# Patient Record
Sex: Female | Born: 1970
Health system: Southern US, Community
[De-identification: ages and names within clinical notes are randomized; demographics above are authoritative.]

## PROBLEM LIST (undated history)

## (undated) DIAGNOSIS — R569 Unspecified convulsions: Secondary | ICD-10-CM

## (undated) DIAGNOSIS — E039 Hypothyroidism, unspecified: Secondary | ICD-10-CM

## (undated) DIAGNOSIS — F32A Depression, unspecified: Secondary | ICD-10-CM

## (undated) DIAGNOSIS — D649 Anemia, unspecified: Secondary | ICD-10-CM

## (undated) DIAGNOSIS — E785 Hyperlipidemia, unspecified: Secondary | ICD-10-CM

## (undated) DIAGNOSIS — G919 Hydrocephalus, unspecified: Secondary | ICD-10-CM

## (undated) DIAGNOSIS — E669 Obesity, unspecified: Secondary | ICD-10-CM

## (undated) DIAGNOSIS — K219 Gastro-esophageal reflux disease without esophagitis: Secondary | ICD-10-CM

## (undated) DIAGNOSIS — F329 Major depressive disorder, single episode, unspecified: Secondary | ICD-10-CM

## (undated) HISTORY — DX: Hydrocephalus, unspecified: G91.9

## (undated) HISTORY — DX: Gastro-esophageal reflux disease without esophagitis: K21.9

## (undated) HISTORY — DX: Hyperlipidemia, unspecified: E78.5

## (undated) HISTORY — PX: VENTRICULOPERITONEAL SHUNT: SHX204

## (undated) HISTORY — DX: Unspecified convulsions: R56.9

## (undated) HISTORY — DX: Hypothyroidism, unspecified: E03.9

## (undated) HISTORY — DX: Obesity, unspecified: E66.9

## (undated) HISTORY — DX: Anemia, unspecified: D64.9

## (undated) HISTORY — DX: Major depressive disorder, single episode, unspecified: F32.9

## (undated) HISTORY — DX: Depression, unspecified: F32.A

---

## 1994-06-18 HISTORY — PX: ANKLE SURGERY: SHX546

## 1997-12-23 ENCOUNTER — Other Ambulatory Visit: Admission: RE | Admit: 1997-12-23 | Discharge: 1997-12-23 | Payer: Self-pay | Admitting: Obstetrics and Gynecology

## 1998-12-05 ENCOUNTER — Other Ambulatory Visit: Admission: RE | Admit: 1998-12-05 | Discharge: 1998-12-05 | Payer: Self-pay | Admitting: Obstetrics and Gynecology

## 1999-12-28 ENCOUNTER — Other Ambulatory Visit: Admission: RE | Admit: 1999-12-28 | Discharge: 1999-12-28 | Payer: Self-pay | Admitting: Obstetrics and Gynecology

## 2001-03-10 ENCOUNTER — Other Ambulatory Visit: Admission: RE | Admit: 2001-03-10 | Discharge: 2001-03-10 | Payer: Self-pay | Admitting: Gynecology

## 2002-04-01 ENCOUNTER — Other Ambulatory Visit: Admission: RE | Admit: 2002-04-01 | Discharge: 2002-04-01 | Payer: Self-pay | Admitting: Gynecology

## 2002-11-20 ENCOUNTER — Encounter: Payer: Self-pay | Admitting: Neurosurgery

## 2002-11-20 ENCOUNTER — Ambulatory Visit (HOSPITAL_COMMUNITY): Admission: RE | Admit: 2002-11-20 | Discharge: 2002-11-20 | Payer: Self-pay | Admitting: Neurosurgery

## 2003-03-29 ENCOUNTER — Emergency Department (HOSPITAL_COMMUNITY): Admission: EM | Admit: 2003-03-29 | Discharge: 2003-03-29 | Payer: Self-pay | Admitting: Emergency Medicine

## 2003-05-11 ENCOUNTER — Other Ambulatory Visit: Admission: RE | Admit: 2003-05-11 | Discharge: 2003-05-11 | Payer: Self-pay | Admitting: Gynecology

## 2003-11-15 ENCOUNTER — Ambulatory Visit (HOSPITAL_COMMUNITY): Admission: RE | Admit: 2003-11-15 | Discharge: 2003-11-15 | Payer: Self-pay | Admitting: Neurosurgery

## 2004-11-14 ENCOUNTER — Other Ambulatory Visit: Admission: RE | Admit: 2004-11-14 | Discharge: 2004-11-14 | Payer: Self-pay | Admitting: Internal Medicine

## 2004-11-14 ENCOUNTER — Other Ambulatory Visit: Admission: RE | Admit: 2004-11-14 | Discharge: 2004-11-14 | Payer: Self-pay | Admitting: Obstetrics and Gynecology

## 2005-05-27 ENCOUNTER — Inpatient Hospital Stay (HOSPITAL_COMMUNITY): Admission: AD | Admit: 2005-05-27 | Discharge: 2005-05-29 | Payer: Self-pay | Admitting: Obstetrics and Gynecology

## 2006-05-11 ENCOUNTER — Emergency Department (HOSPITAL_COMMUNITY): Admission: EM | Admit: 2006-05-11 | Discharge: 2006-05-11 | Payer: Self-pay | Admitting: Emergency Medicine

## 2006-05-12 ENCOUNTER — Emergency Department (HOSPITAL_COMMUNITY): Admission: EM | Admit: 2006-05-12 | Discharge: 2006-05-12 | Payer: Self-pay | Admitting: Emergency Medicine

## 2007-12-12 ENCOUNTER — Encounter: Admission: RE | Admit: 2007-12-12 | Discharge: 2007-12-12 | Payer: Self-pay | Admitting: Neurosurgery

## 2008-01-31 ENCOUNTER — Emergency Department (HOSPITAL_COMMUNITY): Admission: EM | Admit: 2008-01-31 | Discharge: 2008-01-31 | Payer: Self-pay | Admitting: Emergency Medicine

## 2010-05-15 ENCOUNTER — Encounter: Admission: RE | Admit: 2010-05-15 | Discharge: 2010-05-15 | Payer: Self-pay | Admitting: Family Medicine

## 2010-07-08 ENCOUNTER — Encounter: Payer: Self-pay | Admitting: Family Medicine

## 2010-11-03 NOTE — Discharge Summary (Signed)
Ashley Boone, Ashley Boone             ACCOUNT NO.:  [id number]   MEDICAL RECORD NO.:  [id number]          PATIENT TYPE:  INP   LOCATION:  9110                          FACILITY:  [facility]   PHYSICIAN:  Zenaida Niece, M.D.DATE OF BIRTH:  1970/10/18   DATE OF ADMISSION:  05/27/2005  DATE OF DISCHARGE:  05/29/2005                                 DISCHARGE SUMMARY   ADMISSION DIAGNOSES:  1.  Intrauterine pregnancy at 39 weeks.  2.  Previous cesarean section and vaginal birth after cesarean.  3.  Hypothyroidism.  4.  Advanced maternal age.  5.  Group B strep carrier.   DISCHARGE DIAGNOSES:  1.  Intrauterine pregnancy at 39 weeks.  2.  Previous cesarean section and vaginal birth after cesarean.  3.  Hypothyroidism.  4.  Advanced maternal age.  5.  Mild shoulder dystocia.  6.  Group B strep carrier.   PROCEDURES:  On May 28, 2005, she had a VBAC.   HISTORY AND PHYSICAL:  This is a 40 year old white female gravida 3, para 2-  0-0-2 with an EGA of [redacted] weeks by in vitro fertilization, who presents with a  complaint of regular contractions.  Evaluation on admission revealed her to  be 5 cm dilated with regular contractions.  Prenatal care complicated by  advanced maternal age with normal triple screening, normal ultrasound,  previous low-transverse cesarean section and VBAC, and she desires another  VBAC.  She also conceived by in vitro fertilization.   PRENATAL LABORATORY:  Blood type is O positive with negative antibody  screen.  RPR non-reactive, rubella immune, hepatitis B surface antigen  negative, HIV negative, gonorrhea and chlamydia negative, group B strep is  positive.   PAST OBSTETRIC HISTORY:  1995 low transverse cesarean section at 40 weeks  for breech presentation, 8 pounds 12 ounces.  In 1998, VBAC at 39 weeks, 8  pounds 8 ounces, no complications.   PAST MEDICAL HISTORY:  Hypothyroidism and hydrocephalous with a VP shunt.   PAST SURGICAL HISTORY:  A fractured ankle,  laparoscopy, VP shunt, and  cesarean section.   MEDICATIONS:  Synthroid.   PHYSICAL EXAMINATION:  VITAL SIGNS:  She is afebrile with stable vital  signs.  Fetal heart tracing is reassuring.  ABDOMEN:  Gravid, non-tender with an estimated fetal weight of 8 pounds.  CERVIX:  On my first exam, she is 8, 80, and -1 with a vertex presentation  and amniotomy reveals clear fluid.   HOSPITAL COURSE:  The patient was admitted and continued to labor on her  own.  She was put on penicillin for group B strep prophylaxis.  She then had  a slightly protracted course and was put on low-dose Pitocin.  She then  progressed to complete, pushed well, and on the morning of May 27, 2005  had a vaginal delivery of a viable female infant with Apgars of 8 and 9 that  weighed 8 pounds 13 ounces.  A loose nuchal cord x1 was reduced.  She had a  mild shoulder dystocia, which resolved just with McRoberts' maneuver.  Placenta delivered spontaneous, was intact.  She had a  second degree  laceration repaired with 3-0 Vicryl with local block and estimated blood  loss was less than 500 mL.  Uterine scar palpated intact.  Postpartum, she  had no significant complications and on postpartum day #2 was felt to be  stable enough for discharge home.   DISCHARGE INSTRUCTIONS:  Regular diet, pelvic rest, follow-up in six weeks.   MEDICATIONS:  1.  Percocet, #20, p.r.n.  2.  Over-the-counter Ibuprofen as needed p.r.n.   She is also given our discharge pamphlet.      Zenaida Niece, M.D.  Electronically Signed     TDM/MEDQ  D:  05/29/2005  T:  05/29/2005  Job:  [id number]

## 2012-11-13 IMAGING — RF DG UGI W/ HIGH DENSITY W/KUB
19 of 24 series · 19 of 24 positions shown · non-contrast
Comparison: None.

CLINICAL DATA: Heartburn

UPPER GI SERIES WITH KUB
TECHNIQUE: Routine upper GI series was performed with thin and
high density barium.
Fluoroscopy Time: 3.5 minutes

[Series 2: run · 1 of 1 slices shown (1 of 19)]
[im 1/1]
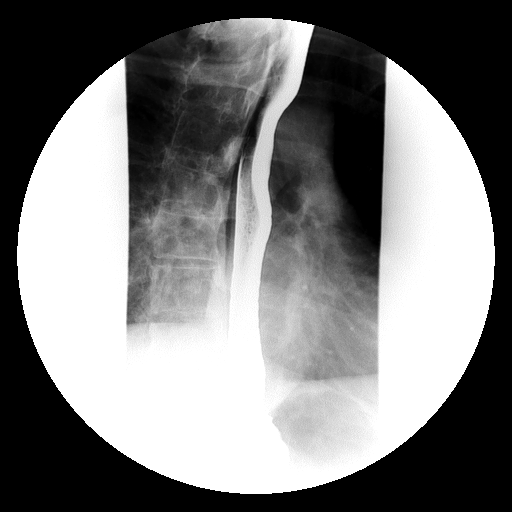

[Series 3: run · 1 of 1 slices shown (2 of 19)]
[im 1/1]
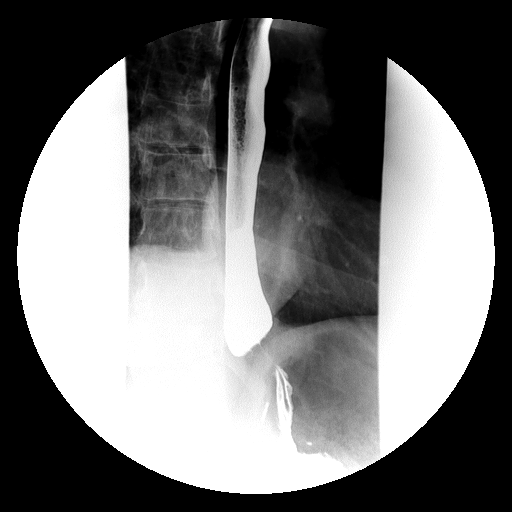

[Series 5: run · 1 of 1 slices shown (3 of 19)]
[im 1/1]
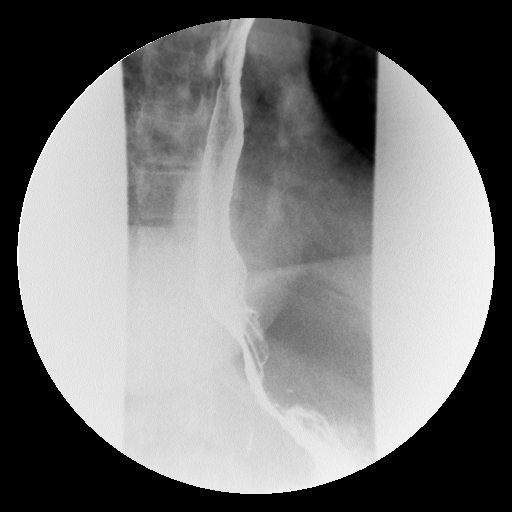

[Series 6: run · 1 of 1 slices shown (4 of 19)]
[im 1/1]
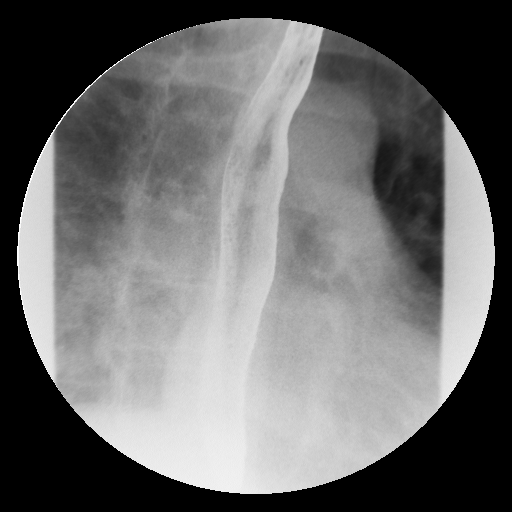

[Series 8: run · 1 of 1 slices shown (5 of 19)]
[im 1/1]
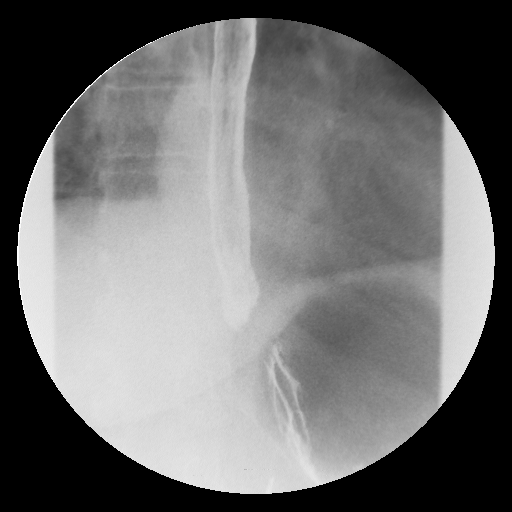

[Series 10: run · 1 of 1 slices shown (6 of 19)]
[im 1/1]
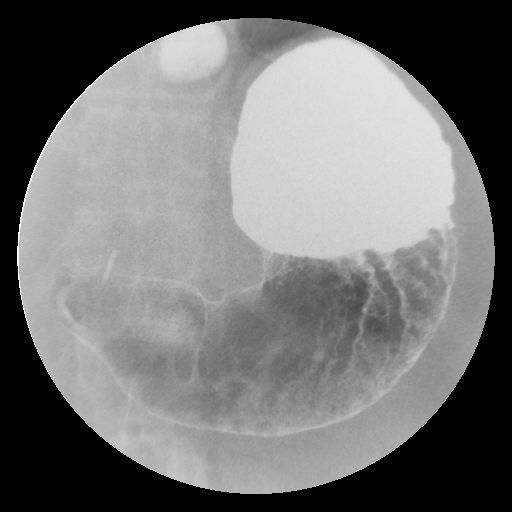

[Series 12: run · 1 of 1 slices shown (7 of 19)]
[im 1/1]
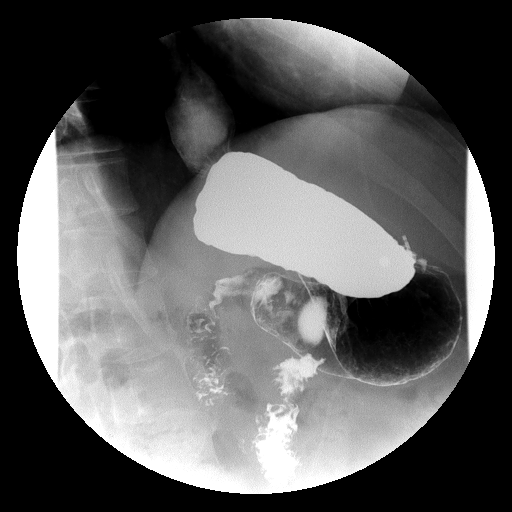

[Series 13: run · 1 of 1 slices shown (8 of 19)]
[im 1/1]
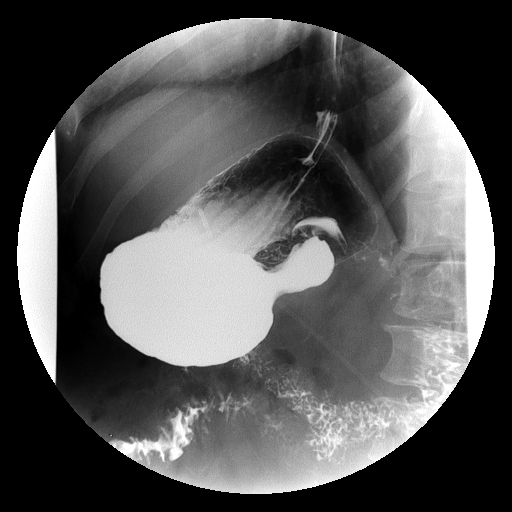

[Series 15: run · 1 of 1 slices shown (9 of 19)]
[im 1/1]
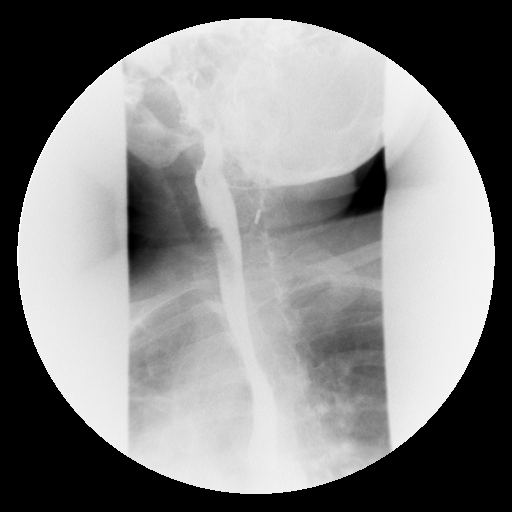

[Series 17: run · 1 of 1 slices shown (10 of 19)]
[im 1/1]
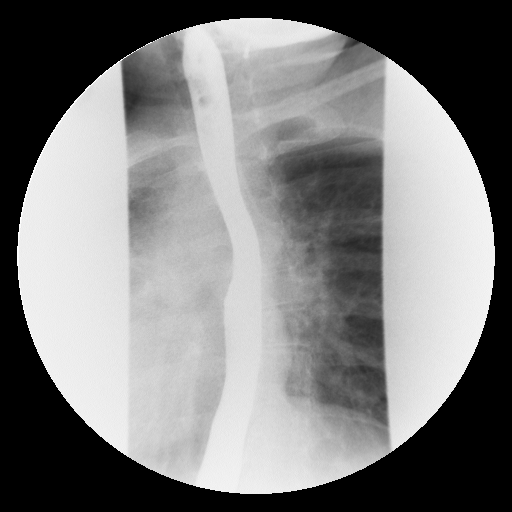

[Series 18: run · 1 of 1 slices shown (11 of 19)]
[im 1/1]
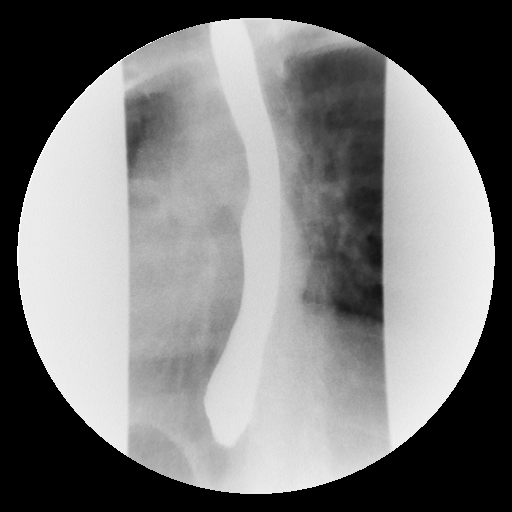

[Series 19: run · 1 of 1 slices shown (12 of 19)]
[im 1/1]
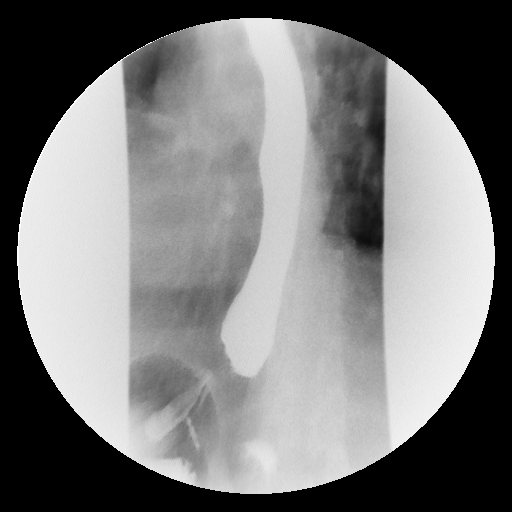

[Series 20: run · 1 of 1 slices shown (13 of 19)]
[im 1/1]
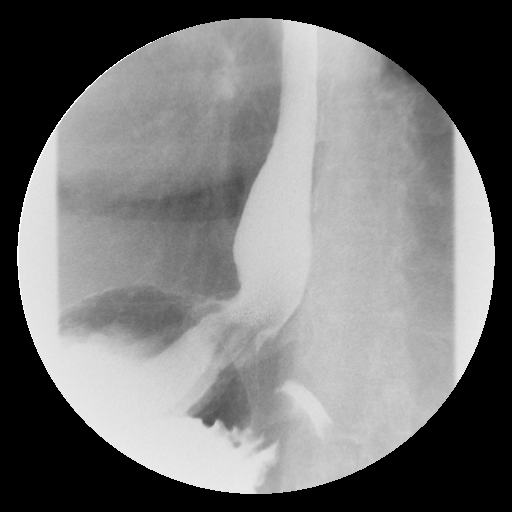

[Series 22: run · 1 of 1 slices shown (14 of 19)]
[im 1/1]
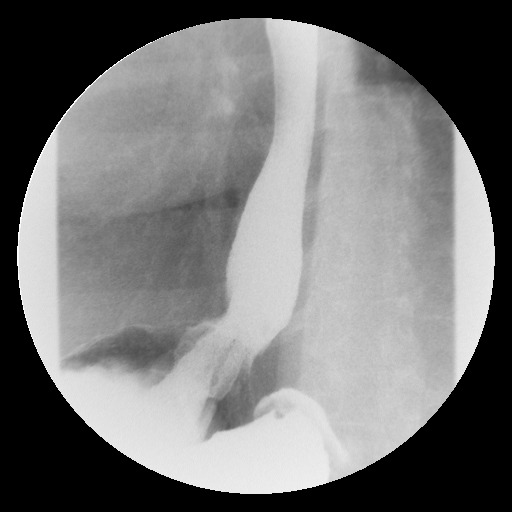

[Series 23: run · 1 of 1 slices shown (15 of 19)]
[im 1/1]
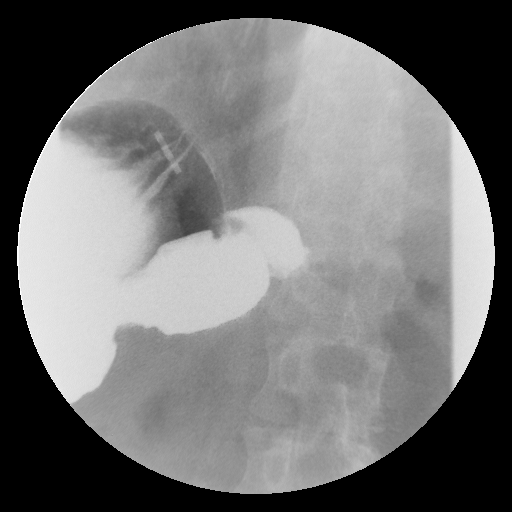

[Series 24: run · 1 of 1 slices shown (16 of 19)]
[im 1/1]
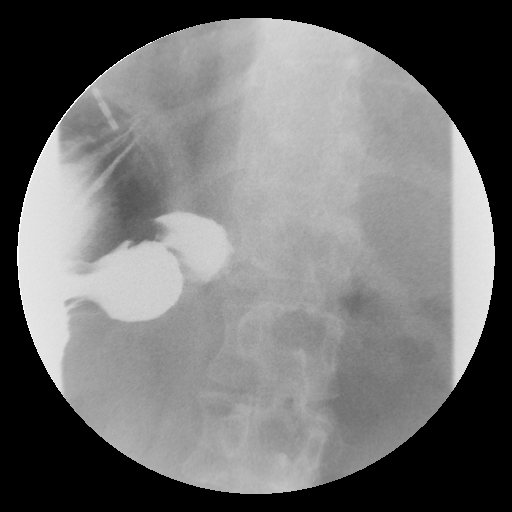

[Series 25: run · 1 of 1 slices shown (17 of 19)]
[im 1/1]
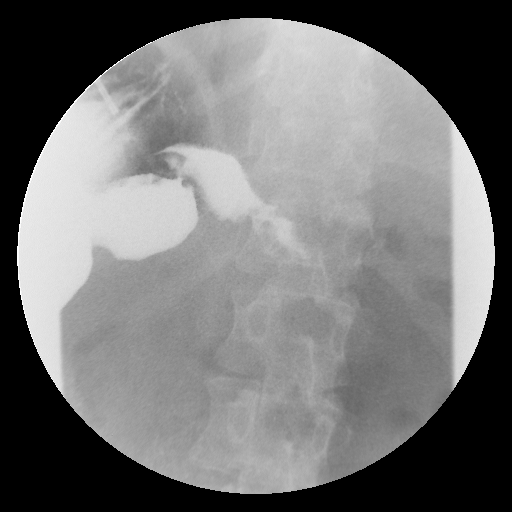

[Series 27: run · 1 of 1 slices shown (18 of 19)]
[im 1/1]
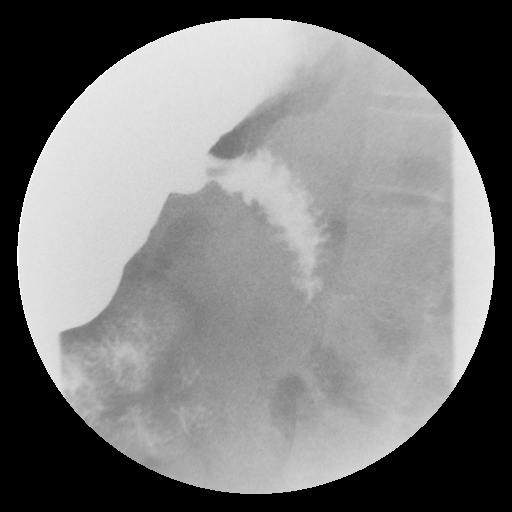

[Series 28: run · 1 of 1 slices shown (19 of 19)]
[im 1/1]
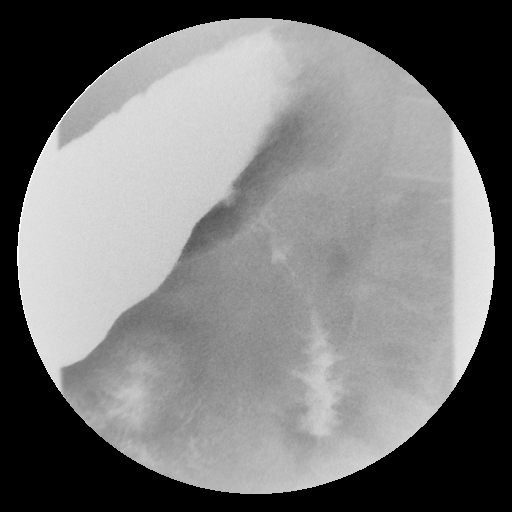

[19 of 24 positions shown; findings below may reference images not displayed]

FINDINGS: A double contrast study shows the mucosa of the esophagus
to be normal.  A single contrast exam shows the swallowing
mechanism to be normal.  No hiatal hernia is seen.  Mild to
moderate gastroesophageal reflux is noted.

The stomach is normal in contour and peristalsis.  The duodenal
bulb fills and the duodenal loop is in normal position.  A barium
pill was given at the end of the study which did pass into the
stomach without delay.
IMPRESSION: 1.  Mild to moderate gastroesophageal reflux.
2.  Barium pill passes in the stomach without delay.

## 2013-07-14 ENCOUNTER — Ambulatory Visit (INDEPENDENT_AMBULATORY_CARE_PROVIDER_SITE_OTHER): Payer: BC Managed Care – PPO | Admitting: Family Medicine

## 2013-07-14 ENCOUNTER — Encounter: Payer: Self-pay | Admitting: Family Medicine

## 2013-07-14 VITALS — BP 132/78 | HR 84 | Temp 97.9°F | Ht 67.5 in | Wt 206.8 lb

## 2013-07-14 DIAGNOSIS — E039 Hypothyroidism, unspecified: Secondary | ICD-10-CM

## 2013-07-14 DIAGNOSIS — G911 Obstructive hydrocephalus: Secondary | ICD-10-CM

## 2013-07-14 DIAGNOSIS — G919 Hydrocephalus, unspecified: Secondary | ICD-10-CM

## 2013-07-14 DIAGNOSIS — K219 Gastro-esophageal reflux disease without esophagitis: Secondary | ICD-10-CM

## 2013-07-14 DIAGNOSIS — F3289 Other specified depressive episodes: Secondary | ICD-10-CM

## 2013-07-14 DIAGNOSIS — E669 Obesity, unspecified: Secondary | ICD-10-CM

## 2013-07-14 DIAGNOSIS — F32A Depression, unspecified: Secondary | ICD-10-CM

## 2013-07-14 DIAGNOSIS — F329 Major depressive disorder, single episode, unspecified: Secondary | ICD-10-CM

## 2013-07-14 LAB — BASIC METABOLIC PANEL
BUN: 15 mg/dL (ref 6–23)
CO2: 25 mEq/L (ref 19–32)
Calcium: 9.5 mg/dL (ref 8.4–10.5)
Chloride: 108 mEq/L (ref 96–112)
Creatinine, Ser: 1 mg/dL (ref 0.4–1.2)
GFR: 65.72 mL/min (ref >60.00)
Glucose, Bld: 90 mg/dL (ref 70–99)
Potassium: 4.9 mEq/L (ref 3.5–5.1)
Sodium: 140 mEq/L (ref 135–145)

## 2013-07-14 LAB — TSH: TSH: 3.53 u[IU]/mL (ref 0.35–5.50)

## 2013-07-14 MED ORDER — BUPROPION HCL ER (XL) 150 MG PO TB24: 150.0000 mg | ORAL_TABLET | Freq: Every day | ORAL | Status: DC

## 2013-07-14 NOTE — Assessment & Plan Note (Signed)
recheck TSH today.  Then will continue levothyroxine.  Will need refill of med after it arrives.

## 2013-07-14 NOTE — Progress Notes (Signed)
   Subjective:    Patient ID: Ashley Boone, female    DOB: 11/09/1970, 43 y.o.   MRN: [id number]  HPI CC: new pt to establish  [facility]([place]  Would like to establish today.  Congenital hydrocephalus - s/p multiple shunts and revisions - laying supine worsens hydrocephalus - starts feeling headache from hydrocephalus.  Has seen Dr. Franky [facility].  No h/o migraines, no h/o seizures.  Hypothyroidism - longtime diagnosis, on levothyroxine for years.  No recent TSH in last 8 months.  GERD - controlled with omeprazole.  Depression - wellbutrin XL works well for this.  Lives with and son (19yo), daughter (16yo), daughter (8yo), and FIL (93yo) Occupation: self employed - [employer] Activity: walks dog Diet: good water, fruits/vegetables daily  Preventative: No recent CPE  Medications and allergies reviewed and updated in chart.  Past histories reviewed and updated if relevant as below. There are no active problems to display for this patient.  Past Medical History  Diagnosis Date  . Hypothyroidism   . Hydrocephalus     shunt ([facility])  . GERD (gastroesophageal reflux disease)   . Depression    Past Surgical History  Procedure Laterality Date  . Shunt revision ventricular-peritoneal  [phone],1981,1988    [place]  . Ankle surgery Left 1996    with plates and screws   History  Substance Use Topics  . Smoking status: Never Smoker   . Smokeless tobacco: Never Used  . Alcohol Use: No   Family History  Problem Relation Age of Onset  . CAD Father [id number]    MI  . Hypertension Father   . Diabetes Father   . Cancer Neg Hx   . Stroke Neg Hx    No Known Allergies No current outpatient prescriptions on file prior to visit.   No current facility-administered medications on file prior to visit.    Review of Systems Per HPI    Objective:   Physical Exam  Nursing note and vitals reviewed. Constitutional: She is oriented to  person, place, and time. She appears well-developed and well-nourished. No distress.  HENT:  Head: Normocephalic and atraumatic.  Mouth/Throat: Oropharynx is clear and moist. No oropharyngeal exudate.  Eyes: Conjunctivae and EOM are normal. Pupils are equal, round, and reactive to light. No scleral icterus.  Neck: Normal range of motion. Neck supple. Thyromegaly present.  Cardiovascular: Normal rate, regular rhythm, normal heart sounds and intact distal pulses.   No murmur heard. Pulses:      Radial pulses are 2+ on the right side, and 2+ on the left side.  Pulmonary/Chest: Effort normal and breath sounds normal. No respiratory distress. She has no wheezes. She has no rales.  Abdominal: Soft. Normal appearance and bowel sounds are normal. There is no tenderness.  scar mid abdomen  Musculoskeletal: Normal range of motion. She exhibits no edema.  Lymphadenopathy:    She has no cervical adenopathy.  Neurological: She is alert and oriented to person, place, and time.  CN grossly intact, station and gait intact  Skin: Skin is warm and dry. No rash noted.  Psychiatric: She has a normal mood and affect. Her behavior is normal. Judgment and thought content normal.       Assessment & Plan:

## 2013-07-14 NOTE — Patient Instructions (Signed)
Good to meet you today return as needed for physical in next year. Blood work today I will request records from Dr. [facility] [place]

## 2013-07-14 NOTE — Progress Notes (Signed)
Pre-visit discussion using our clinic review tool. No additional management support is needed unless otherwise documented below in the visit note.  

## 2013-07-14 NOTE — Assessment & Plan Note (Signed)
Chronic, stable.  Last revision in 1988.  Has not seen NSG recently.

## 2013-07-14 NOTE — Assessment & Plan Note (Signed)
Body mass index is 31.89 kg/(m^2).

## 2013-07-14 NOTE — Assessment & Plan Note (Signed)
Continue PPI.  sxs develop off of this.

## 2013-07-14 NOTE — Assessment & Plan Note (Signed)
Stable on wellbutrin XL 150mg  once daily. Klonopin was previously prescribed - but this was removed from her list today - pt states she never used.

## 2013-07-16 ENCOUNTER — Other Ambulatory Visit: Payer: Self-pay | Admitting: Family Medicine

## 2013-07-16 ENCOUNTER — Encounter: Payer: Self-pay | Admitting: *Deleted

## 2013-07-16 MED ORDER — LEVOTHYROXINE SODIUM 150 MCG PO TABS: 150.0000 ug | ORAL_TABLET | Freq: Every day | ORAL | Status: DC

## 2014-01-02 ENCOUNTER — Other Ambulatory Visit: Payer: Self-pay | Admitting: Family Medicine

## 2014-01-02 DIAGNOSIS — G919 Hydrocephalus, unspecified: Secondary | ICD-10-CM

## 2014-01-02 DIAGNOSIS — K219 Gastro-esophageal reflux disease without esophagitis: Secondary | ICD-10-CM

## 2014-01-02 DIAGNOSIS — E669 Obesity, unspecified: Secondary | ICD-10-CM

## 2014-01-02 DIAGNOSIS — E039 Hypothyroidism, unspecified: Secondary | ICD-10-CM

## 2014-01-04 ENCOUNTER — Other Ambulatory Visit: Payer: BC Managed Care – PPO

## 2014-01-11 ENCOUNTER — Encounter: Payer: BC Managed Care – PPO | Admitting: Family Medicine

## 2014-01-11 DIAGNOSIS — Z0289 Encounter for other administrative examinations: Secondary | ICD-10-CM

## 2014-02-25 ENCOUNTER — Ambulatory Visit (INDEPENDENT_AMBULATORY_CARE_PROVIDER_SITE_OTHER): Payer: BC Managed Care – PPO | Admitting: Family Medicine

## 2014-02-25 ENCOUNTER — Encounter: Payer: Self-pay | Admitting: Family Medicine

## 2014-02-25 VITALS — BP 118/82 | HR 77 | Temp 98.1°F | Wt 219.0 lb

## 2014-02-25 DIAGNOSIS — J018 Other acute sinusitis: Secondary | ICD-10-CM

## 2014-02-25 MED ORDER — FLUTICASONE PROPIONATE 50 MCG/ACT NA SUSP: 2.0000 | Freq: Every day | NASAL | Status: DC

## 2014-02-25 MED ORDER — AMOXICILLIN-POT CLAVULANATE 875-125 MG PO TABS: 1.0000 | ORAL_TABLET | Freq: Two times a day (BID) | ORAL | Status: DC

## 2014-02-25 NOTE — Progress Notes (Signed)
Pre visit review using our clinic review tool, if applicable. No additional management support is needed unless otherwise documented below in the visit note.  Sx started a few days ago.  Taking nyquil and tylenol.  No FCNAVD.  No rash.  No ear pain.  Minimal ST.  Facial pain, frontal and maxillary, B.  Inc HA with cough.  Some cough- more recently.  No sputum. Less facial pain with a hot shower.    H/o hydrocephalus with shunt in place, no sx of shunt malfunction.   Meds, vitals, and allergies reviewed.   ROS: See HPI.  Otherwise, noncontributory.  GEN: nad, alert and oriented HEENT: mucous membranes moist, tm w/o erythema, nasal exam w/o erythema, clear discharge noted, stuffier on R>L nostril,  OP with cobblestoning, max and frontal sinuses ttp, PERRL NECK: supple w/o LA CV: rrr.   PULM: ctab, no inc wob EXT: no edema SKIN: no acute rash

## 2014-02-25 NOTE — Patient Instructions (Signed)
Use otc flonase in the meantime along with warm compresses on your face.  Try to get some rest and start the antibiotics in a few days if not better. Take care.

## 2014-02-26 DIAGNOSIS — J019 Acute sinusitis, unspecified: Secondary | ICD-10-CM | POA: Insufficient documentation

## 2014-02-26 NOTE — Assessment & Plan Note (Signed)
Nontoxic, no sx of shunt malfunction.   D/w pt.  Short duration, nontoxic.  ddx d/w pt.  Could be viral.  Hold abx for now, use flonase and if not better, then start abx.  She agrees.  F/u prn.

## 2014-07-22 ENCOUNTER — Ambulatory Visit (INDEPENDENT_AMBULATORY_CARE_PROVIDER_SITE_OTHER): Payer: BLUE CROSS/BLUE SHIELD | Admitting: Family Medicine

## 2014-07-22 ENCOUNTER — Encounter: Payer: Self-pay | Admitting: Family Medicine

## 2014-07-22 VITALS — BP 142/92 | HR 84 | Temp 98.1°F | Wt 218.2 lb

## 2014-07-22 DIAGNOSIS — J019 Acute sinusitis, unspecified: Secondary | ICD-10-CM

## 2014-07-22 MED ORDER — GUAIFENESIN-CODEINE 100-10 MG/5ML PO SYRP: 5.0000 mL | ORAL_SOLUTION | Freq: Every evening | ORAL | Status: DC | PRN

## 2014-07-22 NOTE — Progress Notes (Signed)
BP 142/92 mmHg  Pulse 84  Temp(Src) 98.1 F (36.7 C) (Oral)  Wt 218 lb 4 oz (98.998 kg)  LMP 07/21/2014 (Exact Date)   CC: "I feel like I have a sinus infection"  Subjective:    Patient ID: Ashley Boone, female    DOB: 1971/03/29, 44 y.o.   MRN: [id number]  HPI: Ashley Boone is a 44 y.o. female presenting on 07/22/2014 for Sinusitis   2d h/o maxillary sinus pain, facial pain/pressure, also with productive cough of mucous. Off and on L earache. + ST and PNdrainage. Mild rattling with breathing. Cough keeping her up at night time.  No fevers/chills, no tooth pain.  Tried tylenol sinus, without improvement. No sick contacts at home.  No smokers at home.  No asthma history. H/o hydrocephalus with shunt in place   Relevant past medical, surgical, family and social history reviewed and updated as indicated. Interim medical history since our last visit reviewed. Allergies and medications reviewed and updated. Current Outpatient Prescriptions on File Prior to Visit  Medication Sig  . levothyroxine (SYNTHROID, LEVOTHROID) 150 MCG tablet Take 1 tablet (150 mcg total) by mouth daily before breakfast.  . Multiple Vitamin (MULTIVITAMIN) tablet Take 1 tablet by mouth daily.  Marland Kitchen. omeprazole (PRILOSEC OTC) 20 MG tablet Take 20 mg by mouth daily.  . fluticasone (FLONASE) 50 MCG/ACT nasal spray Place 2 sprays into both nostrils daily. (Patient not taking: Reported on 07/22/2014)   No current facility-administered medications on file prior to visit.    Review of Systems Per HPI unless specifically indicated above     Objective:    BP 142/92 mmHg  Pulse 84  Temp(Src) 98.1 F (36.7 C) (Oral)  Wt 218 lb 4 oz (98.998 kg)  LMP 07/21/2014 (Exact Date)  Wt Readings from Last 3 Encounters:  07/22/14 218 lb 4 oz (98.998 kg)  02/25/14 219 lb (99.338 kg)  07/14/13 206 lb 12 oz (93.781 kg)    Physical Exam  Constitutional: She appears well-developed and well-nourished. No distress.    HENT:  Head: Normocephalic and atraumatic.  Right Ear: Hearing, tympanic membrane, external ear and ear canal normal.  Left Ear: Hearing, tympanic membrane, external ear and ear canal normal.  Nose: Mucosal edema (and nare inflammation/injection) present. No rhinorrhea. Right sinus exhibits maxillary sinus tenderness and frontal sinus tenderness. Left sinus exhibits maxillary sinus tenderness and frontal sinus tenderness.  Mouth/Throat: Uvula is midline, oropharynx is clear and moist and mucous membranes are normal. No oropharyngeal exudate, posterior oropharyngeal edema, posterior oropharyngeal erythema or tonsillar abscesses.  Eyes: Conjunctivae and EOM are normal. Pupils are equal, round, and reactive to light. No scleral icterus.  Neck: Normal range of motion. Neck supple.  Cardiovascular: Normal rate, regular rhythm, normal heart sounds and intact distal pulses.   No murmur heard. Pulmonary/Chest: Effort normal and breath sounds normal. No respiratory distress. She has no wheezes. She has no rales.  Lymphadenopathy:    She has no cervical adenopathy.  Skin: Skin is warm and dry. No rash noted.  Nursing note and vitals reviewed.      Assessment & Plan:   Problem List Items Addressed This Visit    Sinusitis, acute - Primary    Anticipate viral given short duration. Supportive care as per instructions. Red flags to suggest bacterial infection discussed. Update if not improving in 7 days.      Relevant Medications   guaiFENesin-codeine (ROBITUSSIN AC) syrup 100-10 mg/325mL       Follow up plan:  Return if symptoms worsen or fail to improve.

## 2014-07-22 NOTE — Assessment & Plan Note (Signed)
Anticipate viral given short duration. Supportive care as per instructions. Red flags to suggest bacterial infection discussed. Update if not improving in 7 days.

## 2014-07-22 NOTE — Progress Notes (Signed)
Pre visit review using our clinic review tool, if applicable. No additional management support is needed unless otherwise documented below in the visit note. 

## 2014-07-22 NOTE — Patient Instructions (Addendum)
You have a sinus infection, likely viral. Take medicine as prescribed: cheratussin for night time. Push fluids and plenty of rest. Nasal saline irrigation or neti pot to help drain sinuses. May use plain mucinex or immediate release guaifenesin with plenty of fluid to help mobilize mucous. Ibuprofen 400-600mg  twice daily with food. Please let us know if fever >101.5, trouble opening/closing mouth, difficulty swallowing, or worsening instead of improving as expected.

## 2014-08-04 ENCOUNTER — Other Ambulatory Visit: Payer: Self-pay | Admitting: Family Medicine

## 2014-09-09 ENCOUNTER — Other Ambulatory Visit: Payer: Self-pay | Admitting: Family Medicine

## 2014-10-18 ENCOUNTER — Other Ambulatory Visit: Payer: Self-pay | Admitting: Family Medicine

## 2014-10-25 ENCOUNTER — Other Ambulatory Visit: Payer: Self-pay | Admitting: Family Medicine

## 2014-11-03 ENCOUNTER — Other Ambulatory Visit: Payer: Self-pay | Admitting: Family Medicine

## 2016-06-18 LAB — HM MAMMOGRAPHY: HM Mammogram: NORMAL (ref 0–4)

## 2016-06-20 ENCOUNTER — Ambulatory Visit (INDEPENDENT_AMBULATORY_CARE_PROVIDER_SITE_OTHER): Payer: BLUE CROSS/BLUE SHIELD | Admitting: Internal Medicine

## 2016-06-20 ENCOUNTER — Encounter: Payer: Self-pay | Admitting: Internal Medicine

## 2016-06-20 VITALS — BP 126/78 | HR 83 | Temp 98.4°F | Wt 213.0 lb

## 2016-06-20 DIAGNOSIS — J069 Acute upper respiratory infection, unspecified: Secondary | ICD-10-CM

## 2016-06-20 DIAGNOSIS — B9789 Other viral agents as the cause of diseases classified elsewhere: Secondary | ICD-10-CM

## 2016-06-20 NOTE — Patient Instructions (Signed)
Viral Illness, Adult Viruses are tiny germs that can get into a person's body and cause illness. There are many different types of viruses, and they cause many types of illness. Viral illnesses can range from mild to severe. They can affect various parts of the body. Common illnesses that are caused by a virus include colds and the flu. Viral illnesses also include serious conditions such as HIV/AIDS (human immunodeficiency virus/acquired immunodeficiency syndrome). A few viruses have been linked to certain cancers. What are the causes? Many types of viruses can cause illness. Viruses invade cells in your body, multiply, and cause the infected cells to malfunction or die. When the cell dies, it releases more of the virus. When this happens, you develop symptoms of the illness, and the virus continues to spread to other cells. If the virus takes over the function of the cell, it can cause the cell to divide and grow out of control, as is the case when a virus causes cancer. Different viruses get into the body in different ways. You can get a virus by:  Swallowing food or water that is contaminated with the virus.  Breathing in droplets that have been coughed or sneezed into the air by an infected person.  Touching a surface that has been contaminated with the virus and then touching your eyes, nose, or mouth.  Being bitten by an insect or animal that carries the virus.  Having sexual contact with a person who is infected with the virus.  Being exposed to blood or fluids that contain the virus, either through an open cut or during a transfusion. If a virus enters your body, your body's defense system (immune system) will try to fight the virus. You may be at higher risk for a viral illness if your immune system is weak. What are the signs or symptoms? Symptoms vary depending on the type of virus and the location of the cells that it invades. Common symptoms of the main types of viral illnesses  include: Cold and flu viruses   Fever.  Headache.  Sore throat.  Muscle aches.  Nasal congestion.  Cough. Digestive system (gastrointestinal) viruses   Fever.  Abdominal pain.  Nausea.  Diarrhea. Liver viruses (hepatitis)   Loss of appetite.  Tiredness.  Yellowing of the skin (jaundice). Brain and spinal cord viruses   Fever.  Headache.  Stiff neck.  Nausea and vomiting.  Confusion or sleepiness. Skin viruses   Warts.  Itching.  Rash. Sexually transmitted viruses   Discharge.  Swelling.  Redness.  Rash. How is this treated? Viruses can be difficult to treat because they live within cells. Antibiotic medicines do not treat viruses because these drugs do not get inside cells. Treatment for a viral illness may include:  Resting and drinking plenty of fluids.  Medicines to relieve symptoms. These can include over-the-counter medicine for pain and fever, medicines for cough or congestion, and medicines to relieve diarrhea.  Antiviral medicines. These drugs are available only for certain types of viruses. They may help reduce flu symptoms if taken early. There are also many antiviral medicines for hepatitis and HIV/AIDS. Some viral illnesses can be prevented with vaccinations. A common example is the flu shot. Follow these instructions at home: Medicines    Take over-the-counter and prescription medicines only as told by your health care provider.  If you were prescribed an antiviral medicine, take it as told by your health care provider. Do not stop taking the medicine even if you start to   feel better.  Be aware of when antibiotics are needed and when they are not needed. Antibiotics do not treat viruses. If your health care provider thinks that you may have a bacterial infection as well as a viral infection, you may get an antibiotic.  Do not ask for an antibiotic prescription if you have been diagnosed with a viral illness. That will not make  your illness go away faster.  Frequently taking antibiotics when they are not needed can lead to antibiotic resistance. When this develops, the medicine no longer works against the bacteria that it normally fights. General instructions   Drink enough fluids to keep your urine clear or pale yellow.  Rest as much as possible.  Return to your normal activities as told by your health care provider. Ask your health care provider what activities are safe for you.  Keep all follow-up visits as told by your health care provider. This is important. How is this prevented? Take these actions to reduce your risk of viral infection:  Eat a healthy diet and get enough rest.  Wash your hands often with soap and water. This is especially important when you are in public places. If soap and water are not available, use hand sanitizer.  Avoid close contact with friends and family who have a viral illness.  If you travel to areas where viral gastrointestinal infection is common, avoid drinking water or eating raw food.  Keep your immunizations up to date. Get a flu shot every year as told by your health care provider.  Do not share toothbrushes, nail clippers, razors, or needles with other people.  Always practice safe sex. Contact a health care provider if:  You have symptoms of a viral illness that do not go away.  Your symptoms come back after going away.  Your symptoms get worse. Get help right away if:  You have trouble breathing.  You have a severe headache or a stiff neck.  You have severe vomiting or abdominal pain. This information is not intended to replace advice given to you by your health care provider. Make sure you discuss any questions you have with your health care provider. Document Released: 10/14/2015 Document Revised: 11/16/2015 Document Reviewed: 10/14/2015 [employer]vier Interactive Patient Education  2017 [employer]  

## 2016-06-20 NOTE — Progress Notes (Signed)
HPI  Pt presents to the clinic today with c/o runny nose, ear fullness and cough. This started yesterday. She is blowing clear mucous out of her nose. She denies ear pain or decreased hearing. The cough is productive of yellow mucous. She reports she has run fever up to 101.0, had chills and body aches. She has tried Ibuprofen with some relief. She has no history of allergies or breathing problems. She has had sick contacts. She did not get her flu shot this year.  Review of Systems        Past Medical History:  Diagnosis Date  . Depression   . GERD (gastroesophageal reflux disease)   . Hydrocephalus    shunt ([place])  . Hypothyroidism   . Obesity     Family History  Problem Relation Age of Onset  . CAD Father 6768    MI  . Hypertension Father   . Diabetes Father   . Cancer Neg Hx   . Stroke Neg Hx     Social History   Social History  . Marital status: Married    Spouse name: N/A  . Number of children: N/A  . Years of education: N/A   Occupational History  . Not on file.   Social History Main Topics  . Smoking status: Never Smoker  . Smokeless tobacco: Never Used  . Alcohol use No  . Drug use: No  . Sexual activity: Not on file   Other Topics Concern  . Not on file   Social History Narrative   Lives with and son (19yo), daughter (16yo), daughter (8yo), and FIL (93yo)   Occupation: self employed - [employer]   Activity: walks dog   Diet: good water, fruits/vegetables daily    No Known Allergies   Constitutional: Positive headache, fatigue and fever. Denies abrupt weight changes.  HEENT:  Positive runny nose, ear fullness. Denies eye redness, eye pain, pressure behind the eyes, facial pain, nasal congestion, ear pain, ringing in the ears, wax buildup or sore throat. Respiratory: Positive cough. Denies difficulty breathing or shortness of breath.  Cardiovascular: Denies chest pain, chest tightness, palpitations or swelling in the hands  or feet.   No other specific complaints in a complete review of systems (except as listed in HPI above).  Objective:   BP 126/78   Pulse 83   Temp 98.4 F (36.9 C) (Oral)   Wt 213 lb (96.6 kg)   SpO2 98%   BMI 32.87 kg/m  Wt Readings from Last 3 Encounters:  06/20/16 213 lb (96.6 kg)  07/22/14 218 lb 4 oz (99 kg)  02/25/14 219 lb (99.3 kg)     General: Appears her stated age, in NAD. HEENT: Head: normal shape and size, no sinus tenderness noted; Eyes: sclera white, no icterus, conjunctiva pink; Ears: Tm's gray and intact, normal light reflex, + serous effusion bilaterally; Nose: mucosa pink and moist, septum midline; Throat/Mouth: + PND. Teeth present, mucosa erythematous and moist, no exudate noted, no lesions or ulcerations noted.  Neck: No cervical lymphadenopathy.  Cardiovascular: Normal rate and rhythm. Pulmonary/Chest: Normal effort and positive vesicular breath sounds. No respiratory distress. No wheezes, rales or ronchi noted.       Assessment & Plan:   Viral Upper Respiratory Infection with Cough:  Get some rest and drink plenty of water Start Flonase daily OTC x 1 week Continue Ibuprofen Delsym as needed for cough  RTC as needed or if symptoms persist.   Nicki ReaperBAITY, REGINA, NP

## 2016-06-21 ENCOUNTER — Telehealth: Payer: Self-pay

## 2016-06-21 NOTE — Telephone Encounter (Signed)
Pt is aware that viral Sx can last as long as 14 days and she needs to continue treatment as listed below and give more time. Pt expressed understanding  Get some rest and drink plenty of water Start Flonase daily OTC x 1 week Continue Ibuprofen Delsym as needed for cough

## 2016-06-21 NOTE — Telephone Encounter (Signed)
She has only had symptoms for 2 days. She needs to continue with OTC treatment.

## 2016-06-21 NOTE — Telephone Encounter (Signed)
Patient saw Nicki Reaperegina Baity, NP yesterday for acute, viral URI w/ cough and fever symptoms.  Currently on flonase daily, delsym for cough and ibuprofen.   Patient reports severe sinus pain and pressure to face and behind eyes starting last night and today.  She is strongly requesting antibiotic as she feels this has progressed in to a sinus infection due to the amount of pain and discomfort that she is in.  Please advise if can call in R/X to her pharmacy.

## 2016-06-22 ENCOUNTER — Ambulatory Visit: Payer: BLUE CROSS/BLUE SHIELD | Admitting: Family Medicine

## 2016-07-02 ENCOUNTER — Ambulatory Visit (INDEPENDENT_AMBULATORY_CARE_PROVIDER_SITE_OTHER): Payer: BLUE CROSS/BLUE SHIELD | Admitting: Family Medicine

## 2016-07-02 ENCOUNTER — Encounter: Payer: Self-pay | Admitting: Family Medicine

## 2016-07-02 VITALS — BP 122/82 | HR 64 | Temp 98.3°F | Wt 215.0 lb

## 2016-07-02 DIAGNOSIS — E039 Hypothyroidism, unspecified: Secondary | ICD-10-CM

## 2016-07-02 DIAGNOSIS — K219 Gastro-esophageal reflux disease without esophagitis: Secondary | ICD-10-CM

## 2016-07-02 DIAGNOSIS — G919 Hydrocephalus, unspecified: Secondary | ICD-10-CM | POA: Diagnosis not present

## 2016-07-02 DIAGNOSIS — Z23 Encounter for immunization: Secondary | ICD-10-CM

## 2016-07-02 LAB — BASIC METABOLIC PANEL
BUN: 11 mg/dL (ref 6–23)
CO2: 23 mEq/L (ref 19–32)
Calcium: 8.8 mg/dL (ref 8.4–10.5)
Chloride: 107 mEq/L (ref 96–112)
Creatinine, Ser: 0.74 mg/dL (ref 0.40–1.20)
GFR: 89.67 mL/min (ref >60.00)
Glucose, Bld: 90 mg/dL (ref 70–99)
Potassium: 4.2 mEq/L (ref 3.5–5.1)
Sodium: 139 mEq/L (ref 135–145)

## 2016-07-02 LAB — LDL CHOLESTEROL, DIRECT: Direct LDL: 116 mg/dL

## 2016-07-02 LAB — LIPID PANEL
Cholesterol: 210 mg/dL — ABNORMAL HIGH (ref 0–200)
HDL: 41.8 mg/dL (ref >39.00)
NonHDL: 167.83
Total CHOL/HDL Ratio: 5
Triglycerides: 316 mg/dL — ABNORMAL HIGH (ref 0.0–149.0)
VLDL: 63.2 mg/dL — ABNORMAL HIGH (ref 0.0–40.0)

## 2016-07-02 LAB — T4, FREE: Free T4: 0.6 ng/dL (ref 0.60–1.60)

## 2016-07-02 LAB — TSH: TSH: 9.95 u[IU]/mL — ABNORMAL HIGH (ref 0.35–4.50)

## 2016-07-02 MED ORDER — LEVOTHYROXINE SODIUM 100 MCG PO TABS: 100.0000 ug | ORAL_TABLET | Freq: Every day | ORAL | 1 refills | Status: DC

## 2016-07-02 NOTE — Assessment & Plan Note (Addendum)
Encouraged f/u with NSG as overdue.

## 2016-07-02 NOTE — Progress Notes (Signed)
BP 122/82   Pulse 64   Temp 98.3 F (36.8 C) (Oral)   Wt 215 lb (97.5 kg)   LMP 06/26/2016   BMI 33.18 kg/m    CC: f/u visit Subjective:    Patient ID: Ashley Boone, female    DOB: [birth year], 46 y.o.   MRN: [id number]  HPI: Ashley Boone is a 46 y.o. female presenting on 07/02/2016 for Follow-up   Seen earlier this month with viral URI - Ashley Boone is better from this.  Hypothyroidism - ran out of med a long time ago. Stays tired. No cold intolerance, no unexpected weight changes, skin changes or hair changes, constipation, palpitations. R throat feels sore intermittently with occasional globus sensation and odynophagia. Taking omeprazole 20mg  daily.   Relevant past medical, surgical, family and social history reviewed and updated as indicated. Interim medical history since our last visit reviewed. Allergies and medications reviewed and updated. Current Outpatient Prescriptions on File Prior to Visit  Medication Sig  . fluticasone (FLONASE) 50 MCG/ACT nasal spray Place 2 sprays into both nostrils daily.  . Multiple Vitamin (MULTIVITAMIN) tablet Take 1 tablet by mouth daily.  Marland Kitchen omeprazole (PRILOSEC OTC) 20 MG tablet Take 20 mg by mouth daily.   No current facility-administered medications on file prior to visit.     Review of Systems Per HPI unless specifically indicated in ROS section     Objective:    BP 122/82   Pulse 64   Temp 98.3 F (36.8 C) (Oral)   Wt 215 lb (97.5 kg)   LMP 06/26/2016   BMI 33.18 kg/m   Wt Readings from Last 3 Encounters:  07/02/16 215 lb (97.5 kg)  06/20/16 213 lb (96.6 kg)  07/22/14 218 lb 4 oz (99 kg)    Physical Exam  Constitutional: Ashley Boone appears well-developed and well-nourished. No distress.  Cardiovascular: Normal rate, regular rhythm, normal heart sounds and intact distal pulses.   No murmur heard. Pulmonary/Chest: Effort normal and breath sounds normal. No respiratory distress. Ashley Boone has no wheezes. Ashley Boone has no rales.  Nursing  note and vitals reviewed.  Results for orders placed or performed in visit on 07/14/13  TSH  Result Value Ref Range   TSH 3.53 0.35 - 5.50 uIU/mL  Basic metabolic panel  Result Value Ref Range   Sodium 140 135 - 145 mEq/L   Potassium 4.9 3.5 - 5.1 mEq/L   Chloride 108 96 - 112 mEq/L   CO2 25 19 - 32 mEq/L   Glucose, Bld 90 70 - 99 mg/dL   BUN 15 6 - 23 mg/dL   Creatinine, Ser 1.0 0.4 - 1.2 mg/dL   Calcium 9.5 8.4 - 40.9 mg/dL   GFR 81.19 >14.78 mL/min      Assessment & Plan:   Problem List Items Addressed This Visit    GERD (gastroesophageal reflux disease)    Stable on daily low dose PPI      Hydrocephalus    Encouraged f/u with NSG as overdue.       Hypothyroidism - Primary    Chronic, out of med for past 1+ yr. Refilled today, will start slow taper at x 1 wk then . RTC labs only to recheck levels in 2 months. Pt agrees with plan. RTC 6 mo CPE      Relevant Medications   levothyroxine (SYNTHROID, LEVOTHROID) 100 MCG tablet   Other Relevant Orders   TSH   T3   T4, free   TSH   T3  T4, free   Basic metabolic panel   Lipid panel    Other Visit Diagnoses    Need for influenza vaccination       Relevant Orders   Flu Vaccine QUAD 36+ mos PF IM (Fluarix & Fluzone Quad PF) (Completed)       Follow up plan: Return in about 6 months (around 12/30/2016) for annual exam, prior fasting for blood work.  Eustaquio BoydenJavier Obelia Bonello, MD

## 2016-07-02 NOTE — Patient Instructions (Addendum)
Flu shot today.  Blood work today Start levothyroxine daily then after 1 week increase to daily ( dose sent to pharmacy).  Return in 2 months for labs only. Return in 6 months for follow up visit and physical.

## 2016-07-02 NOTE — Assessment & Plan Note (Signed)
Stable on daily low dose PPI

## 2016-07-02 NOTE — Assessment & Plan Note (Addendum)
Chronic, out of med for past 1+ yr. Refilled today, will start slow taper at x 1 wk then . RTC labs only to recheck levels in 2 months. Pt agrees with plan. RTC 6 mo CPE

## 2016-07-02 NOTE — Progress Notes (Signed)
Pre visit review using our clinic review tool, if applicable. No additional management support is needed unless otherwise documented below in the visit note. 

## 2016-07-03 LAB — T3: T3, Total: 140 ng/dL (ref 76–181)

## 2016-07-06 ENCOUNTER — Encounter: Payer: Self-pay | Admitting: *Deleted

## 2016-08-30 ENCOUNTER — Encounter (INDEPENDENT_AMBULATORY_CARE_PROVIDER_SITE_OTHER): Payer: Self-pay

## 2016-08-30 ENCOUNTER — Other Ambulatory Visit (INDEPENDENT_AMBULATORY_CARE_PROVIDER_SITE_OTHER): Payer: BLUE CROSS/BLUE SHIELD

## 2016-08-30 DIAGNOSIS — E039 Hypothyroidism, unspecified: Secondary | ICD-10-CM | POA: Diagnosis not present

## 2016-08-30 LAB — TSH: TSH: 12.53 u[IU]/mL — ABNORMAL HIGH (ref 0.35–4.50)

## 2016-08-30 LAB — T4, FREE: Free T4: 0.6 ng/dL (ref 0.60–1.60)

## 2016-08-31 LAB — T3: T3, Total: 140 ng/dL (ref 76–181)

## 2016-09-02 ENCOUNTER — Other Ambulatory Visit: Payer: Self-pay | Admitting: Family Medicine

## 2016-09-02 DIAGNOSIS — E039 Hypothyroidism, unspecified: Secondary | ICD-10-CM

## 2016-09-02 MED ORDER — LEVOTHYROXINE SODIUM 150 MCG PO TABS: 150.0000 ug | ORAL_TABLET | Freq: Every day | ORAL | 1 refills | Status: DC

## 2016-10-08 DIAGNOSIS — K219 Gastro-esophageal reflux disease without esophagitis: Secondary | ICD-10-CM | POA: Diagnosis not present

## 2016-10-08 DIAGNOSIS — J029 Acute pharyngitis, unspecified: Secondary | ICD-10-CM | POA: Diagnosis not present

## 2016-10-16 DIAGNOSIS — K219 Gastro-esophageal reflux disease without esophagitis: Secondary | ICD-10-CM | POA: Diagnosis not present

## 2016-10-16 DIAGNOSIS — J029 Acute pharyngitis, unspecified: Secondary | ICD-10-CM | POA: Diagnosis not present

## 2016-11-01 NOTE — Telephone Encounter (Signed)
Error

## 2016-11-02 ENCOUNTER — Other Ambulatory Visit (INDEPENDENT_AMBULATORY_CARE_PROVIDER_SITE_OTHER): Payer: BLUE CROSS/BLUE SHIELD

## 2016-11-02 DIAGNOSIS — E039 Hypothyroidism, unspecified: Secondary | ICD-10-CM

## 2016-11-02 LAB — TSH: TSH: 1.97 u[IU]/mL (ref 0.35–4.50)

## 2016-12-17 DIAGNOSIS — K219 Gastro-esophageal reflux disease without esophagitis: Secondary | ICD-10-CM | POA: Diagnosis not present

## 2016-12-17 DIAGNOSIS — J029 Acute pharyngitis, unspecified: Secondary | ICD-10-CM | POA: Diagnosis not present

## 2016-12-22 ENCOUNTER — Other Ambulatory Visit: Payer: Self-pay | Admitting: Family Medicine

## 2016-12-22 DIAGNOSIS — E039 Hypothyroidism, unspecified: Secondary | ICD-10-CM

## 2016-12-22 DIAGNOSIS — E782 Mixed hyperlipidemia: Secondary | ICD-10-CM | POA: Insufficient documentation

## 2016-12-25 ENCOUNTER — Other Ambulatory Visit: Payer: BLUE CROSS/BLUE SHIELD

## 2017-01-01 ENCOUNTER — Encounter: Payer: BLUE CROSS/BLUE SHIELD | Admitting: Family Medicine

## 2017-01-18 DIAGNOSIS — T63591A Toxic effect of contact with other venomous fish, accidental (unintentional), initial encounter: Secondary | ICD-10-CM | POA: Diagnosis not present

## 2017-01-18 DIAGNOSIS — Z23 Encounter for immunization: Secondary | ICD-10-CM | POA: Diagnosis not present

## 2017-01-18 DIAGNOSIS — L309 Dermatitis, unspecified: Secondary | ICD-10-CM | POA: Diagnosis not present

## 2017-01-18 DIAGNOSIS — T63621A Toxic effect of contact with other jellyfish, accidental (unintentional), initial encounter: Secondary | ICD-10-CM | POA: Diagnosis not present

## 2017-01-30 DIAGNOSIS — J3501 Chronic tonsillitis: Secondary | ICD-10-CM | POA: Diagnosis not present

## 2017-03-11 DIAGNOSIS — L920 Granuloma annulare: Secondary | ICD-10-CM | POA: Diagnosis not present

## 2017-03-11 DIAGNOSIS — R208 Other disturbances of skin sensation: Secondary | ICD-10-CM | POA: Diagnosis not present

## 2017-04-08 ENCOUNTER — Other Ambulatory Visit: Payer: Self-pay | Admitting: Family Medicine

## 2017-04-25 DIAGNOSIS — K146 Glossodynia: Secondary | ICD-10-CM | POA: Diagnosis not present

## 2017-04-25 DIAGNOSIS — R208 Other disturbances of skin sensation: Secondary | ICD-10-CM | POA: Diagnosis not present

## 2017-04-25 DIAGNOSIS — L920 Granuloma annulare: Secondary | ICD-10-CM | POA: Diagnosis not present

## 2017-04-25 DIAGNOSIS — R05 Cough: Secondary | ICD-10-CM | POA: Diagnosis not present

## 2017-05-16 DIAGNOSIS — M79672 Pain in left foot: Secondary | ICD-10-CM | POA: Diagnosis not present

## 2017-05-16 DIAGNOSIS — M722 Plantar fascial fibromatosis: Secondary | ICD-10-CM | POA: Diagnosis not present

## 2017-05-29 DIAGNOSIS — Z01419 Encounter for gynecological examination (general) (routine) without abnormal findings: Secondary | ICD-10-CM | POA: Diagnosis not present

## 2017-05-31 DIAGNOSIS — D649 Anemia, unspecified: Secondary | ICD-10-CM | POA: Diagnosis not present

## 2017-05-31 DIAGNOSIS — Z6834 Body mass index (BMI) 34.0-34.9, adult: Secondary | ICD-10-CM | POA: Diagnosis not present

## 2017-05-31 DIAGNOSIS — Z124 Encounter for screening for malignant neoplasm of cervix: Secondary | ICD-10-CM | POA: Diagnosis not present

## 2017-05-31 DIAGNOSIS — Z130 Encounter for screening for diseases of the blood and blood-forming organs and certain disorders involving the immune mechanism: Secondary | ICD-10-CM | POA: Diagnosis not present

## 2017-05-31 DIAGNOSIS — Z1389 Encounter for screening for other disorder: Secondary | ICD-10-CM | POA: Diagnosis not present

## 2017-05-31 DIAGNOSIS — Z01419 Encounter for gynecological examination (general) (routine) without abnormal findings: Secondary | ICD-10-CM | POA: Diagnosis not present

## 2017-05-31 DIAGNOSIS — Z1231 Encounter for screening mammogram for malignant neoplasm of breast: Secondary | ICD-10-CM | POA: Diagnosis not present

## 2017-06-03 DIAGNOSIS — M79672 Pain in left foot: Secondary | ICD-10-CM | POA: Diagnosis not present

## 2017-06-20 DIAGNOSIS — M722 Plantar fascial fibromatosis: Secondary | ICD-10-CM | POA: Diagnosis not present

## 2017-06-20 DIAGNOSIS — M79672 Pain in left foot: Secondary | ICD-10-CM | POA: Diagnosis not present

## 2017-07-15 DIAGNOSIS — D649 Anemia, unspecified: Secondary | ICD-10-CM | POA: Diagnosis not present

## 2017-09-21 DIAGNOSIS — J029 Acute pharyngitis, unspecified: Secondary | ICD-10-CM | POA: Diagnosis not present

## 2017-09-25 DIAGNOSIS — M79671 Pain in right foot: Secondary | ICD-10-CM | POA: Diagnosis not present

## 2017-10-17 ENCOUNTER — Other Ambulatory Visit: Payer: Self-pay | Admitting: Family Medicine

## 2018-01-15 ENCOUNTER — Other Ambulatory Visit: Payer: Self-pay | Admitting: Family Medicine

## 2018-03-17 ENCOUNTER — Ambulatory Visit: Payer: BLUE CROSS/BLUE SHIELD | Admitting: Family Medicine

## 2018-03-17 ENCOUNTER — Encounter (INDEPENDENT_AMBULATORY_CARE_PROVIDER_SITE_OTHER): Payer: Self-pay

## 2018-03-17 ENCOUNTER — Encounter: Payer: Self-pay | Admitting: Family Medicine

## 2018-03-17 VITALS — BP 118/86 | HR 72 | Temp 97.8°F | Ht 67.0 in | Wt 226.2 lb

## 2018-03-17 DIAGNOSIS — E049 Nontoxic goiter, unspecified: Secondary | ICD-10-CM

## 2018-03-17 DIAGNOSIS — E782 Mixed hyperlipidemia: Secondary | ICD-10-CM | POA: Diagnosis not present

## 2018-03-17 DIAGNOSIS — E041 Nontoxic single thyroid nodule: Secondary | ICD-10-CM | POA: Insufficient documentation

## 2018-03-17 DIAGNOSIS — K146 Glossodynia: Secondary | ICD-10-CM

## 2018-03-17 DIAGNOSIS — Z23 Encounter for immunization: Secondary | ICD-10-CM

## 2018-03-17 DIAGNOSIS — G919 Hydrocephalus, unspecified: Secondary | ICD-10-CM

## 2018-03-17 DIAGNOSIS — E039 Hypothyroidism, unspecified: Secondary | ICD-10-CM | POA: Diagnosis not present

## 2018-03-17 DIAGNOSIS — K219 Gastro-esophageal reflux disease without esophagitis: Secondary | ICD-10-CM

## 2018-03-17 MED ORDER — LEVOTHYROXINE SODIUM 150 MCG PO TABS: 150.0000 ug | ORAL_TABLET | Freq: Every day | ORAL | 3 refills | Status: DC

## 2018-03-17 NOTE — Patient Instructions (Addendum)
Flu shot today Gabapentin can cause dizziness - try taking in afternoon and evening - if burning tongue gets worse, talk with ENT about lower 100mg  dose more frequently throughout the day. Levothyroxine refilled today. Take 1/2 tablet for 4 days then increase to full dose.  Return in 6-8 wks for physical, prior for fasting labs.  We will check thyroid ultrasound

## 2018-03-17 NOTE — Assessment & Plan Note (Signed)
Check thyroid US eval for R thyroid nodule given evident R sided thyroid enlargement.

## 2018-03-17 NOTE — Assessment & Plan Note (Signed)
Followed by ENT, on gabapentin but finds this to cause significant dizziness. Discussed gabapentin dosing - start with BID, QPM and QHS. If still not tolerated, advised to touch base with ENT about possible 100mg  dosing.

## 2018-03-17 NOTE — Assessment & Plan Note (Signed)
Ran out of thyroid replacement for the past month - will restart daily - start at 1/2 tab for 4 days then increase to full dose. Check TFTs when returns for CPE in 6 wks. Pt agrees with plan.

## 2018-03-17 NOTE — Progress Notes (Signed)
BP 118/86 (BP Location: Left Arm, Patient Position: Sitting, Cuff Size: Large)   Pulse 72   Temp 97.8 F (36.6 C) (Oral)   Ht 5\' 7"  (1.702 m)   Wt 226 lb 4 oz (102.6 kg)   LMP 01/17/2018   SpO2 95%   BMI 35.44 kg/m    CC: med refill Subjective:    Patient ID: Ashley Boone, female    DOB: 1970/07/10, 47 y.o.   MRN: [id number]  HPI: Ashley Boone is a 47 y.o. female presenting on 03/17/2018 for Cough (C/o cough she has had for yrs. ) and Medication Refill   Here with daughter.   Chronic cough for years - has seen ENT latest Dr Christell Constant at [facility] and note reviewed. Thought burning tongue as well as LPR related. Taking omeprazole 40mg  bid, nortriptyline nightly without benefit. Nortriptyline changed to gabapentin 04/2017 - taking 300mg  about BID but finding some dizziness on this medicine.   Hypothyroidism - regular regimen is levothyroxine daily - ran out 1 month ago. Stays fatigued, some soreness inferior to chin.   Relevant past medical, surgical, family and social history reviewed and updated as indicated. Interim medical history since our last visit reviewed. Allergies and medications reviewed and updated. Outpatient Medications Prior to Visit  Medication Sig Dispense Refill  . fluticasone (FLONASE) 50 MCG/ACT nasal spray Place 2 sprays into both nostrils daily.    Marland Kitchen gabapentin (NEURONTIN) 300 MG capsule Take 300 mg by mouth 3 (three) times daily.  5  . meloxicam (MOBIC) 7.5 MG tablet Take 1 tablet by mouth daily as needed for pain.    . Multiple Vitamin (MULTIVITAMIN) tablet Take 1 tablet by mouth daily.    Marland Kitchen omeprazole (PRILOSEC) 40 MG capsule Take 40 mg by mouth 2 (two) times daily.    Marland Kitchen levothyroxine (SYNTHROID, LEVOTHROID) 150 MCG tablet TAKE 1 TABLET BY MOUTH ONCE DAILY AFTER BREAKFAST 30 tablet 0  . omeprazole (PRILOSEC OTC) 20 MG tablet Take 20 mg by mouth daily.     No facility-administered medications prior to visit.      Per HPI unless specifically  indicated in ROS section below Review of Systems     Objective:    BP 118/86 (BP Location: Left Arm, Patient Position: Sitting, Cuff Size: Large)   Pulse 72   Temp 97.8 F (36.6 C) (Oral)   Ht 5\' 7"  (1.702 m)   Wt 226 lb 4 oz (102.6 kg)   LMP 01/17/2018   SpO2 95%   BMI 35.44 kg/m   Wt Readings from Last 3 Encounters:  03/17/18 226 lb 4 oz (102.6 kg)  07/02/16 215 lb (97.5 kg)  06/20/16 213 lb (96.6 kg)    Physical Exam  Constitutional: She appears well-developed and well-nourished. No distress.  HENT:  Mouth/Throat: Oropharynx is clear and moist. No oropharyngeal exudate.  Eyes: Pupils are equal, round, and reactive to light. EOM are normal.  Neck: Normal range of motion. Neck supple. Thyromegaly (R sided) present.  Cardiovascular: Normal rate, regular rhythm and normal heart sounds.  No murmur heard. Pulmonary/Chest: Effort normal and breath sounds normal. No respiratory distress. She has no wheezes. She has no rales.  Musculoskeletal: She exhibits edema (chronic LLE).  Lymphadenopathy:    She has no cervical adenopathy.  Psychiatric: She has a normal mood and affect.  Nursing note and vitals reviewed.  Results for orders placed or performed in visit on 11/02/16  TSH  Result Value Ref Range   TSH 1.97 0.35 -  4.50 uIU/mL      Assessment & Plan:   Problem List Items Addressed This Visit    Thyroid enlargement    Check thyroid US eval for R thyroid nodule given evident R sided thyroid enlargement.      Relevant Medications   levothyroxine (SYNTHROID, LEVOTHROID) 150 MCG tablet   Mixed hyperlipidemia   Hypothyroidism - Primary    Ran out of thyroid replacement for the past month - will restart daily - start at 1/2 tab for 4 days then increase to full dose. Check TFTs when returns for CPE in 6 wks. Pt agrees with plan.       Relevant Medications   levothyroxine (SYNTHROID, LEVOTHROID) 150 MCG tablet   Other Relevant Orders   US THYROID   Hydrocephalus     GERD (gastroesophageal reflux disease)    Stable on omeprazole 40mg  bid. This is prescribed through ENT.       Relevant Medications   omeprazole (PRILOSEC) 40 MG capsule   Burning tongue syndrome    Followed by ENT, on gabapentin but finds this to cause significant dizziness. Discussed gabapentin dosing - start with BID, QPM and QHS. If still not tolerated, advised to touch base with ENT about possible 100mg  dosing.        Other Visit Diagnoses    Need for influenza vaccination       Relevant Orders   Flu Vaccine QUAD 36+ mos IM (Completed)   US THYROID       Meds ordered this encounter  Medications  . levothyroxine (SYNTHROID, LEVOTHROID) 150 MCG tablet    Sig: Take 1 tablet (150 mcg total) by mouth daily before breakfast.    Dispense:  90 tablet    Refill:  3   Orders Placed This Encounter  Procedures  . US THYROID    Standing Status:   Future    Standing Expiration Date:   05/18/2019    Order Specific Question:   Reason for Exam (SYMPTOM  OR DIAGNOSIS REQUIRED)    Answer:   R thyroid enlargement    Order Specific Question:   Preferred imaging location?    Answer:   [facility]  . Flu Vaccine QUAD 36+ mos IM    Follow up plan: Return in about 6 weeks (around 04/28/2018) for annual exam, prior fasting for blood work.  Eustaquio Boyden, MD

## 2018-03-17 NOTE — Assessment & Plan Note (Signed)
Stable on omeprazole 40mg  bid. This is prescribed through ENT.

## 2018-03-21 ENCOUNTER — Ambulatory Visit
Admission: RE | Admit: 2018-03-21 | Discharge: 2018-03-21 | Disposition: A | Discharge status: Home / Self Care | Payer: BLUE CROSS/BLUE SHIELD | Source: Ambulatory Visit | Attending: Family Medicine | Admitting: Family Medicine

## 2018-03-21 DIAGNOSIS — E041 Nontoxic single thyroid nodule: Secondary | ICD-10-CM | POA: Diagnosis not present

## 2018-03-21 DIAGNOSIS — Z23 Encounter for immunization: Secondary | ICD-10-CM

## 2018-03-21 DIAGNOSIS — E039 Hypothyroidism, unspecified: Secondary | ICD-10-CM

## 2018-03-24 ENCOUNTER — Encounter: Payer: Self-pay | Admitting: Family Medicine

## 2018-04-27 ENCOUNTER — Other Ambulatory Visit: Payer: Self-pay | Admitting: Family Medicine

## 2018-04-27 DIAGNOSIS — E782 Mixed hyperlipidemia: Secondary | ICD-10-CM

## 2018-04-27 DIAGNOSIS — E039 Hypothyroidism, unspecified: Secondary | ICD-10-CM

## 2018-04-29 ENCOUNTER — Other Ambulatory Visit (INDEPENDENT_AMBULATORY_CARE_PROVIDER_SITE_OTHER): Payer: BLUE CROSS/BLUE SHIELD

## 2018-04-29 DIAGNOSIS — E039 Hypothyroidism, unspecified: Secondary | ICD-10-CM | POA: Diagnosis not present

## 2018-04-29 DIAGNOSIS — E782 Mixed hyperlipidemia: Secondary | ICD-10-CM

## 2018-04-29 LAB — LIPID PANEL
Cholesterol: 244 mg/dL — ABNORMAL HIGH (ref 0–200)
HDL: 42.7 mg/dL (ref >39.00)
Total CHOL/HDL Ratio: 6
Triglycerides: 467 mg/dL — ABNORMAL HIGH (ref 0.0–149.0)

## 2018-04-29 LAB — COMPREHENSIVE METABOLIC PANEL
ALT: 44 U/L — ABNORMAL HIGH (ref 0–35)
AST: 25 U/L (ref 0–37)
Albumin: 4.3 g/dL (ref 3.5–5.2)
Alkaline Phosphatase: 52 U/L (ref 39–117)
BUN: 16 mg/dL (ref 6–23)
CO2: 26 mEq/L (ref 19–32)
Calcium: 9.3 mg/dL (ref 8.4–10.5)
Chloride: 108 mEq/L (ref 96–112)
Creatinine, Ser: 0.82 mg/dL (ref 0.40–1.20)
GFR: 79.03 mL/min (ref >60.00)
Glucose, Bld: 104 mg/dL — ABNORMAL HIGH (ref 70–99)
Potassium: 4 mEq/L (ref 3.5–5.1)
Sodium: 143 mEq/L (ref 135–145)
Total Bilirubin: 0.4 mg/dL (ref 0.2–1.2)
Total Protein: 7.1 g/dL (ref 6.0–8.3)

## 2018-04-29 LAB — T4, FREE: Free T4: 1.04 ng/dL (ref 0.60–1.60)

## 2018-04-29 LAB — LDL CHOLESTEROL, DIRECT: Direct LDL: 149 mg/dL

## 2018-04-29 LAB — TSH: TSH: 2.71 u[IU]/mL (ref 0.35–4.50)

## 2018-05-01 ENCOUNTER — Encounter: Payer: Self-pay | Admitting: Family Medicine

## 2018-05-01 DIAGNOSIS — Z0000 Encounter for general adult medical examination without abnormal findings: Secondary | ICD-10-CM | POA: Insufficient documentation

## 2018-05-01 NOTE — Assessment & Plan Note (Signed)
Preventative protocols reviewed and updated unless pt declined. Discussed healthy diet and lifestyle.  

## 2018-05-01 NOTE — Progress Notes (Signed)
BP 124/80 (BP Location: Left Arm, Patient Position: Sitting, Cuff Size: Large)   Pulse 78   Temp 97.8 F (36.6 C) (Oral)   Ht 5' 6.5" (1.689 m)   Wt 228 lb (103.4 kg)   LMP  (Within Months)   SpO2 98%   BMI 36.25 kg/m    CC: CPE Subjective:    Patient ID: Dionisio David, female    DOB: [birth year], 47 y.o.   MRN: [id number]  HPI: JANANI CHAMBER is a 47 y.o. female presenting on 05/02/2018 for Annual Exam   On BID PPI for burning throat and tongue and chronic cough.   Preventative: Colon cancer screening - not due Breast cancer screening - 2018 normal mammogram  Well woman exam with OBGYN Dr Ambrose Mantle with normal paps - due to schedule appt.  Flu shot - yearly Tetanus shot - unsure. Tdap today.  Seat belt use discussed Sunscreen use discussed. No changing moles on skin. Non smoker  Alcohol - none  Dentist q6 mo  Eye exam - yearly  Lives with and son (19yo), daughter (16yo), daughter (8yo), and FIL (93yo) 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  Activity: walks dog  Diet: good water, fruits/vegetables daily   Relevant past medical, surgical, family and social history reviewed and updated as indicated. Interim medical history since our last visit reviewed. Allergies and medications reviewed and updated. Outpatient Medications Prior to Visit  Medication Sig Dispense Refill  . fluticasone (FLONASE) 50 MCG/ACT nasal spray Place 2 sprays into both nostrils daily.    Marland Kitchen gabapentin (NEURONTIN) 300 MG capsule Take 300 mg by mouth 3 (three) times daily.  5  . levothyroxine (SYNTHROID, LEVOTHROID) 150 MCG tablet Take 1 tablet (150 mcg total) by mouth daily before breakfast. 90 tablet 3  . meloxicam (MOBIC) 7.5 MG tablet Take 1 tablet by mouth daily as needed for pain.    . Multiple Vitamin (MULTIVITAMIN) tablet Take 1 tablet by mouth daily.    Marland Kitchen omeprazole (PRILOSEC) 40 MG capsule Take 40 mg by mouth 2 (two) times daily.     No facility-administered  medications prior to visit.      Per HPI unless specifically indicated in ROS section below Review of Systems  Constitutional: Negative for activity change, appetite change, chills, fatigue, fever and unexpected weight change.  HENT: Negative for hearing loss.   Eyes: Negative for visual disturbance.  Respiratory: Positive for cough (chronic, treated with PPI). Negative for chest tightness, shortness of breath and wheezing.   Cardiovascular: Negative for chest pain, palpitations and leg swelling.  Gastrointestinal: Negative for abdominal distention, abdominal pain, blood in stool, constipation, diarrhea, nausea and vomiting.  Genitourinary: Negative for difficulty urinating and hematuria.  Musculoskeletal: Negative for arthralgias, myalgias and neck pain.  Skin: Negative for rash.  Neurological: Negative for dizziness, seizures, syncope and headaches.  Hematological: Negative for adenopathy. Does not bruise/bleed easily.  Psychiatric/Behavioral: Negative for dysphoric mood. The patient is not nervous/anxious.        Objective:    BP 124/80 (BP Location: Left Arm, Patient Position: Sitting, Cuff Size: Large)   Pulse 78   Temp 97.8 F (36.6 C) (Oral)   Ht 5' 6.5" (1.689 m)   Wt 228 lb (103.4 kg)   LMP  (Within Months)   SpO2 98%   BMI 36.25 kg/m   Wt Readings from Last 3 Encounters:  05/02/18 228 lb (103.4 kg)  03/17/18 226 lb 4 oz (102.6 kg)  07/02/16 215 lb (97.5  kg)    Physical Exam  Constitutional: She is oriented to person, place, and time. She appears well-developed and well-nourished. No distress.  HENT:  Head: Normocephalic and atraumatic.  Right Ear: Hearing, tympanic membrane, external ear and ear canal normal.  Left Ear: Hearing, tympanic membrane, external ear and ear canal normal.  Nose: Nose normal.  Mouth/Throat: Uvula is midline, oropharynx is clear and moist and mucous membranes are normal. No oropharyngeal exudate, posterior oropharyngeal edema or posterior  oropharyngeal erythema.  Eyes: Pupils are equal, round, and reactive to light. Conjunctivae and EOM are normal. No scleral icterus.  Neck: Normal range of motion. Neck supple.  Cardiovascular: Normal rate, regular rhythm, normal heart sounds and intact distal pulses.  No murmur heard. Pulses:      Radial pulses are 2+ on the right side, and 2+ on the left side.  Pulmonary/Chest: Effort normal and breath sounds normal. No respiratory distress. She has no wheezes. She has no rales.  Abdominal: Soft. Bowel sounds are normal. She exhibits no distension and no mass. There is no tenderness. There is no rebound and no guarding.  Musculoskeletal: Normal range of motion. She exhibits no edema.  Lymphadenopathy:    She has no cervical adenopathy.  Neurological: She is alert and oriented to person, place, and time.  CN grossly intact, station and gait intact  Skin: Skin is warm and dry. No rash noted.  Psychiatric: She has a normal mood and affect. Her behavior is normal. Judgment and thought content normal.  Nursing note and vitals reviewed.  Results for orders placed or performed in visit on 05/02/18  HM MAMMOGRAPHY  Result Value Ref Range   HM Mammogram Self Reported Normal 0-4 Bi-Rad, Self Reported Normal      Assessment & Plan:   Problem List Items Addressed This Visit    Severe obesity (BMI 35.0-39.9) with comorbidity (HCC)    Encouraged healthy diet and lifestyle changes to affect sustainable weight loss.       Right thyroid nodule    Thyroid US rpt due 04/2018.       Mixed hyperlipidemia    Marked elevation especially triglycerides. Discussed diet changes to control cholesterol levels. Will do 6 months TLC and if no improvement will likely recommend chol med.  The 10-year ASCVD risk score Denman George DC Montez Hageman., et al., 2013) is: 1.9%   Values used to calculate the score:     Age: 56 years     Sex: Female     Is Non-Hispanic African American: No     Diabetic: No     Tobacco smoker: No      Systolic Blood Pressure: 124 mmHg     Is BP treated: No     HDL Cholesterol: 42.7 mg/dL     Total Cholesterol: 244 mg/dL       Hypothyroidism    Chronic, improved. Continue current regimen.       Hydrocephalus (HCC)    Stable period.       Health maintenance examination - Primary    Preventative protocols reviewed and updated unless pt declined. Discussed healthy diet and lifestyle.       Burning tongue syndrome    Sees ENT, managed with gabapentin and PPI Discussed gabapentin dosing to try and avoid sedating side effects.           Meds ordered this encounter  Medications  . ferrous sulfate 324 (65 Fe) MG TBEC    Sig: Take 1 tablet (325 mg total) by  mouth daily.    Dispense:  30 tablet   Orders Placed This Encounter  Procedures  . HM MAMMOGRAPHY    This external order was created through the Results Console.    Follow up plan: Return in about 6 months (around 10/31/2018) for follow up visit.  Eustaquio BoydenJavier Sweden Lesure, MD

## 2018-05-02 ENCOUNTER — Encounter: Payer: Self-pay | Admitting: Family Medicine

## 2018-05-02 ENCOUNTER — Ambulatory Visit (INDEPENDENT_AMBULATORY_CARE_PROVIDER_SITE_OTHER): Payer: BLUE CROSS/BLUE SHIELD | Admitting: Family Medicine

## 2018-05-02 VITALS — BP 124/80 | HR 78 | Temp 97.8°F | Ht 66.5 in | Wt 228.0 lb

## 2018-05-02 DIAGNOSIS — Z0000 Encounter for general adult medical examination without abnormal findings: Secondary | ICD-10-CM | POA: Diagnosis not present

## 2018-05-02 DIAGNOSIS — E041 Nontoxic single thyroid nodule: Secondary | ICD-10-CM

## 2018-05-02 DIAGNOSIS — G919 Hydrocephalus, unspecified: Secondary | ICD-10-CM

## 2018-05-02 DIAGNOSIS — E039 Hypothyroidism, unspecified: Secondary | ICD-10-CM

## 2018-05-02 DIAGNOSIS — E782 Mixed hyperlipidemia: Secondary | ICD-10-CM

## 2018-05-02 DIAGNOSIS — K146 Glossodynia: Secondary | ICD-10-CM

## 2018-05-02 MED ORDER — FERROUS SULFATE 324 (65 FE) MG PO TBEC: 1.0000 | DELAYED_RELEASE_TABLET | Freq: Every day | ORAL | Status: DC

## 2018-05-02 NOTE — Assessment & Plan Note (Signed)
Thyroid US rpt due 04/2018.

## 2018-05-02 NOTE — Patient Instructions (Addendum)
Tdap today. Work on [employer] and lifestyle changes for goalweight loss.  Cholesterol levels were too high! Return in 6 months for repeat labs (cholesterol, sugar) and office visit afterwards.  Health Maintenance, Female Adopting a healthy lifestyle and getting preventive care can go a long way to promote health and wellness. Talk with your health care provider about what schedule of regular examinations is right for you. This is a good chance for you to check in with your provider about disease prevention and staying healthy. In between checkups, there are plenty of things you can do on your own. Experts have done a lot of research about which lifestyle changes and preventive measures are most likely to keep you healthy. Ask your health care provider for more information. Weight and diet Eat a healthy diet  Be sure to include plenty of vegetables, fruits, low-fat dairy products, and lean protein.  Do not eat a lot of foods high in solid fats, added sugars, or salt.  Get regular exercise. This is one of the most important things you can do for your health. ? Most adults should exercise for at least 150 minutes each week. The exercise should increase your heart rate and make you sweat (moderate-intensity exercise). ? Most adults should also do strengthening exercises at least twice a week. This is in addition to the moderate-intensity exercise.  Maintain a healthy weight  Body mass index (BMI) is a measurement that can be used to identify possible weight problems. It estimates body fat based on height and weight. Your health care provider can help determine your BMI and help you achieve or maintain a healthy weight.  For females 48 years of age and older: ? A BMI below 18.5 is considered underweight. ? A BMI of 18.5 to 24.9 is normal. ? A BMI of 25 to 29.9 is considered overweight. ? A BMI of 30 and above is considered obese.  Watch levels of cholesterol and blood lipids  You should  start having your blood tested for lipids and cholesterol at 48 years of age, then have this test every 5 years.  You may need to have your cholesterol levels checked more often if: ? Your lipid or cholesterol levels are high. ? You are older than 48 years of age. ? You are at high risk for heart disease.  Cancer screening Lung Cancer  Lung cancer screening is recommended for adults 83-59 years old who are at high risk for lung cancer because of a history of smoking.  A yearly low-dose CT scan of the lungs is recommended for people who: ? Currently smoke. ? Have quit within the past 15 years. ? Have at least a 30-pack-year history of smoking. A pack year is smoking an average of one pack of cigarettes a day for 1 year.  Yearly screening should continue until it has been 15 years since you quit.  Yearly screening should stop if you develop a health problem that would prevent you from having lung cancer treatment.  Breast Cancer  Practice breast self-awareness. This means understanding how your breasts normally appear and feel.  It also means doing regular breast self-exams. Let your health care provider know about any changes, no matter how small.  If you are in your 20s or 30s, you should have a clinical breast exam (CBE) by a health care provider every 1-3 years as part of a regular health exam.  If you are 65 or older, have a CBE every year. Also consider having  a breast X-ray (mammogram) every year.  If you have a family history of breast cancer, talk to your health care provider about genetic screening.  If you are at high risk for breast cancer, talk to your health care provider about having an MRI and a mammogram every year.  Breast cancer gene (BRCA) assessment is recommended for women who have family members with BRCA-related cancers. BRCA-related cancers include: ? Breast. ? Ovarian. ? Tubal. ? Peritoneal cancers.  Results of the assessment will determine the need  for genetic counseling and BRCA1 and BRCA2 testing.  Cervical Cancer Your health care provider may recommend that you be screened regularly for cancer of the pelvic organs (ovaries, uterus, and vagina). This screening involves a pelvic examination, including checking for microscopic changes to the surface of your cervix (Pap test). You may be encouraged to have this screening done every 3 years, beginning at age 39.  For women ages 29-65, health care providers may recommend pelvic exams and Pap testing every 3 years, or they may recommend the Pap and pelvic exam, combined with testing for human papilloma virus (HPV), every 5 years. Some types of HPV increase your risk of cervical cancer. Testing for HPV may also be done on women of any age with unclear Pap test results.  Other health care providers may not recommend any screening for nonpregnant women who are considered low risk for pelvic cancer and who do not have symptoms. Ask your health care provider if a screening pelvic exam is right for you.  If you have had past treatment for cervical cancer or a condition that could lead to cancer, you need Pap tests and screening for cancer for at least 20 years after your treatment. If Pap tests have been discontinued, your risk factors (such as having a new sexual partner) need to be reassessed to determine if screening should resume. Some women have medical problems that increase the chance of getting cervical cancer. In these cases, your health care provider may recommend more frequent screening and Pap tests.  Colorectal Cancer  This type of cancer can be detected and often prevented.  Routine colorectal cancer screening usually begins at 48 years of age and continues through 47 years of age.  Your health care provider may recommend screening at an earlier age if you have risk factors for colon cancer.  Your health care provider may also recommend using home test kits to check for hidden blood in  the stool.  A small camera at the end of a tube can be used to examine your colon directly (sigmoidoscopy or colonoscopy). This is done to check for the earliest forms of colorectal cancer.  Routine screening usually begins at age 19.  Direct examination of the colon should be repeated every 5-10 years through 48 years of age. However, you may need to be screened more often if early forms of precancerous polyps or small growths are found.  Skin Cancer  Check your skin from head to toe regularly.  Tell your health care provider about any new moles or changes in moles, especially if there is a change in a mole's shape or color.  Also tell your health care provider if you have a mole that is larger than the size of a pencil eraser.  Always use sunscreen. Apply sunscreen liberally and repeatedly throughout the day.  Protect yourself by wearing long sleeves, pants, a wide-brimmed hat, and sunglasses whenever you are outside.  Heart disease, diabetes, and high blood pressure  High blood pressure causes heart disease and increases the risk of stroke. High blood pressure is more likely to develop in: ? People who have blood pressure in the high end of the normal range (130-139/85-89 mm Hg). ? People who are overweight or obese. ? People who are African American.  If you are 14-51 years of age, have your blood pressure checked every 3-5 years. If you are 50 years of age or older, have your blood pressure checked every year. You should have your blood pressure measured twice-once when you are at a hospital or clinic, and once when you are not at a hospital or clinic. Record the average of the two measurements. To check your blood pressure when you are not at a hospital or clinic, you can use: ? An automated blood pressure machine at a pharmacy. ? A home blood pressure monitor.  If you are between 6 years and 19 years old, ask your health care provider if you should take aspirin to prevent  strokes.  Have regular diabetes screenings. This involves taking a blood sample to check your fasting blood sugar level. ? If you are at a normal weight and have a low risk for diabetes, have this test once every three years after 48 years of age. ? If you are overweight and have a high risk for diabetes, consider being tested at a younger age or more often. Preventing infection Hepatitis B  If you have a higher risk for hepatitis B, you should be screened for this virus. You are considered at high risk for hepatitis B if: ? You were born in a country where hepatitis B is common. Ask your health care provider which countries are considered high risk. ? Your parents were born in a high-risk country, and you have not been immunized against hepatitis B (hepatitis B vaccine). ? You have HIV or AIDS. ? You use needles to inject street drugs. ? You live with someone who has hepatitis B. ? You have had sex with someone who has hepatitis B. ? You get hemodialysis treatment. ? You take certain medicines for conditions, including cancer, organ transplantation, and autoimmune conditions.  Hepatitis C  Blood testing is recommended for: ? Everyone born from [birth year] through 1966. ? Anyone with known risk factors for hepatitis C.  Sexually transmitted infections (STIs)  You should be screened for sexually transmitted infections (STIs) including gonorrhea and chlamydia if: ? You are sexually active and are younger than 48 years of age. ? You are older than 48 years of age and your health care provider tells you that you are at risk for this type of infection. ? Your sexual activity has changed since you were last screened and you are at an increased risk for chlamydia or gonorrhea. Ask your health care provider if you are at risk.  If you do not have HIV, but are at risk, it may be recommended that you take a prescription medicine daily to prevent HIV infection. This is called pre-exposure prophylaxis  (PrEP). You are considered at risk if: ? You are sexually active and do not regularly use condoms or know the HIV status of your partner(s). ? You take drugs by injection. ? You are sexually active with a partner who has HIV.  Talk with your health care provider about whether you are at high risk of being infected with HIV. If you choose to begin PrEP, you should first be tested for HIV. You should then be tested every 3 months  for as long as you are taking PrEP. Pregnancy  If you are premenopausal and you may become pregnant, ask your health care provider about preconception counseling.  If you may become pregnant, take 400 to 800 micrograms (mcg) of folic acid every day.  If you want to prevent pregnancy, talk to your health care provider about birth control (contraception). Osteoporosis and menopause  Osteoporosis is a disease in which the bones lose minerals and strength with aging. This can result in serious bone fractures. Your risk for osteoporosis can be identified using a bone density scan.  If you are 8 years of age or older, or if you are at risk for osteoporosis and fractures, ask your health care provider if you should be screened.  Ask your health care provider whether you should take a calcium or vitamin D supplement to lower your risk for osteoporosis.  Menopause may have certain physical symptoms and risks.  Hormone replacement therapy may reduce some of these symptoms and risks. Talk to your health care provider about whether hormone replacement therapy is right for you. Follow these instructions at home:  Schedule regular health, dental, and eye exams.  Stay current with your immunizations.  Do not use any tobacco products including cigarettes, chewing tobacco, or electronic cigarettes.  If you are pregnant, do not drink alcohol.  If you are breastfeeding, limit how much and how often you drink alcohol.  Limit alcohol intake to no more than 1 drink per day for  nonpregnant women. One drink equals 12 ounces of beer, 5 ounces of wine, or 1 ounces of hard liquor.  Do not use street drugs.  Do not share needles.  Ask your health care provider for help if you need support or information about quitting drugs.  Tell your health care provider if you often feel depressed.  Tell your health care provider if you have ever been abused or do not feel safe at home. This information is not intended to replace advice given to you by your health care provider. Make sure you discuss any questions you have with your health care provider. Document Released: 12/18/2010 Document Revised: 11/10/2015 Document Reviewed: 03/08/2015 Elsevier Interactive Patient Education  [employer].

## 2018-05-02 NOTE — Assessment & Plan Note (Signed)
Stable period.  

## 2018-05-02 NOTE — Assessment & Plan Note (Addendum)
Marked elevation especially triglycerides. Discussed diet changes to control cholesterol levels. Will do 6 months TLC and if no improvement will likely recommend chol med.  The 10-year ASCVD risk score Denman George(Goff DC Montez HagemanJr., et al., 2013) is: 1.9%   Values used to calculate the score:     Age: [age] years     Sex: Female     Is Non-Hispanic African American: No     Diabetic: No     Tobacco smoker: No     Systolic Blood Pressure: 124 mmHg     Is BP treated: No     HDL Cholesterol: 42.7 mg/dL     Total Cholesterol: 244 mg/dL

## 2018-05-02 NOTE — Assessment & Plan Note (Addendum)
Sees ENT, managed with gabapentin and PPI Discussed gabapentin dosing to try and avoid sedating side effects.

## 2018-05-02 NOTE — Assessment & Plan Note (Signed)
Encouraged healthy diet and lifestyle changes to affect sustainable weight loss.  

## 2018-05-02 NOTE — Assessment & Plan Note (Signed)
Chronic, improved. Continue current regimen.  

## 2018-05-27 DIAGNOSIS — J069 Acute upper respiratory infection, unspecified: Secondary | ICD-10-CM | POA: Diagnosis not present

## 2018-07-07 DIAGNOSIS — R102 Pelvic and perineal pain: Secondary | ICD-10-CM | POA: Diagnosis not present

## 2018-07-07 DIAGNOSIS — M79604 Pain in right leg: Secondary | ICD-10-CM | POA: Diagnosis not present

## 2018-07-07 DIAGNOSIS — Z6835 Body mass index (BMI) 35.0-35.9, adult: Secondary | ICD-10-CM | POA: Diagnosis not present

## 2018-07-07 DIAGNOSIS — M79605 Pain in left leg: Secondary | ICD-10-CM | POA: Diagnosis not present

## 2018-07-21 DIAGNOSIS — Z124 Encounter for screening for malignant neoplasm of cervix: Secondary | ICD-10-CM | POA: Diagnosis not present

## 2018-07-21 DIAGNOSIS — Z1231 Encounter for screening mammogram for malignant neoplasm of breast: Secondary | ICD-10-CM | POA: Diagnosis not present

## 2018-07-21 DIAGNOSIS — Z1389 Encounter for screening for other disorder: Secondary | ICD-10-CM | POA: Diagnosis not present

## 2018-07-21 DIAGNOSIS — Z01419 Encounter for gynecological examination (general) (routine) without abnormal findings: Secondary | ICD-10-CM | POA: Diagnosis not present

## 2018-07-21 DIAGNOSIS — Z130 Encounter for screening for diseases of the blood and blood-forming organs and certain disorders involving the immune mechanism: Secondary | ICD-10-CM | POA: Diagnosis not present

## 2018-10-29 ENCOUNTER — Other Ambulatory Visit: Payer: Self-pay | Admitting: Family Medicine

## 2018-10-29 DIAGNOSIS — E782 Mixed hyperlipidemia: Secondary | ICD-10-CM

## 2018-10-29 DIAGNOSIS — E041 Nontoxic single thyroid nodule: Secondary | ICD-10-CM

## 2018-10-29 DIAGNOSIS — E039 Hypothyroidism, unspecified: Secondary | ICD-10-CM

## 2018-10-30 ENCOUNTER — Other Ambulatory Visit (INDEPENDENT_AMBULATORY_CARE_PROVIDER_SITE_OTHER): Payer: Self-pay

## 2018-10-30 DIAGNOSIS — E039 Hypothyroidism, unspecified: Secondary | ICD-10-CM

## 2018-10-30 DIAGNOSIS — E782 Mixed hyperlipidemia: Secondary | ICD-10-CM

## 2018-10-30 LAB — LIPID PANEL
Cholesterol: 249 mg/dL — ABNORMAL HIGH (ref 0–200)
HDL: 36.2 mg/dL — ABNORMAL LOW (ref >39.00)
Total CHOL/HDL Ratio: 7
Triglycerides: 491 mg/dL — ABNORMAL HIGH (ref 0.0–149.0)

## 2018-10-30 LAB — COMPREHENSIVE METABOLIC PANEL
ALT: 36 U/L — ABNORMAL HIGH (ref 0–35)
AST: 25 U/L (ref 0–37)
Albumin: 4.1 g/dL (ref 3.5–5.2)
Alkaline Phosphatase: 53 U/L (ref 39–117)
BUN: 11 mg/dL (ref 6–23)
CO2: 28 mEq/L (ref 19–32)
Calcium: 8.8 mg/dL (ref 8.4–10.5)
Chloride: 106 mEq/L (ref 96–112)
Creatinine, Ser: 0.88 mg/dL (ref 0.40–1.20)
GFR: 68.4 mL/min (ref >60.00)
Glucose, Bld: 97 mg/dL (ref 70–99)
Potassium: 3.9 mEq/L (ref 3.5–5.1)
Sodium: 142 mEq/L (ref 135–145)
Total Bilirubin: 0.5 mg/dL (ref 0.2–1.2)
Total Protein: 7 g/dL (ref 6.0–8.3)

## 2018-10-30 LAB — LDL CHOLESTEROL, DIRECT: Direct LDL: 133 mg/dL

## 2018-10-30 LAB — TSH: TSH: 1.46 u[IU]/mL (ref 0.35–4.50)

## 2018-11-03 ENCOUNTER — Ambulatory Visit: Payer: BLUE CROSS/BLUE SHIELD | Admitting: Family Medicine

## 2018-11-04 ENCOUNTER — Telehealth: Payer: Self-pay

## 2018-11-04 MED ORDER — ATORVASTATIN CALCIUM 20 MG PO TABS: 20.0000 mg | ORAL_TABLET | Freq: Every day | ORAL | 3 refills | Status: DC

## 2018-11-04 NOTE — Telephone Encounter (Signed)
Copied from CRM [id number]. Topic: General - Other >> Nov 03, 2018  4:12 PM Ashley Boone, [place] wrote: Reason for CRM: Patient returning call for lab results. Please advise.

## 2018-11-04 NOTE — Addendum Note (Signed)
Addended by: Nanci Pina on: 11/04/2018 02:27 PM   Modules accepted: Orders

## 2018-11-04 NOTE — Telephone Encounter (Signed)
Best number [phone]  Pt returned your call   Pt stated in your voice mail  That dr g was going to put her on some different meds she is ok with this  [facility] on [address]

## 2018-11-04 NOTE — Telephone Encounter (Signed)
Returned pt's call.  [See labs, 10/30/18.]

## 2018-11-04 NOTE — Telephone Encounter (Signed)
Noted. Sent rx for atorvastatin (Lipitor) 20 mg daily, #30/3, to [facility].  Spoke with pt notifying her rx was sent in.

## 2019-03-05 ENCOUNTER — Other Ambulatory Visit: Payer: Self-pay

## 2019-03-05 MED ORDER — LEVOTHYROXINE SODIUM 150 MCG PO TABS: 150.0000 ug | ORAL_TABLET | Freq: Every day | ORAL | 2 refills | Status: DC

## 2019-03-05 MED ORDER — ATORVASTATIN CALCIUM 20 MG PO TABS: 20.0000 mg | ORAL_TABLET | Freq: Every day | ORAL | 2 refills | Status: DC

## 2019-04-05 ENCOUNTER — Telehealth: Payer: Self-pay | Admitting: Family Medicine

## 2019-04-05 DIAGNOSIS — E041 Nontoxic single thyroid nodule: Secondary | ICD-10-CM

## 2019-04-05 NOTE — Telephone Encounter (Addendum)
plz notify pt we're due for rpt thyroid US which I have ordered.   ----- Message from Ria Bush, MD sent at 03/24/2018  7:51 AM EDT ----- Rpt throid [place] for R nodule 1 yr

## 2019-04-13 ENCOUNTER — Ambulatory Visit
Admission: RE | Admit: 2019-04-13 | Discharge: 2019-04-13 | Disposition: A | Discharge status: Home / Self Care | Payer: BC Managed Care – PPO | Source: Ambulatory Visit | Attending: Family Medicine | Admitting: Family Medicine

## 2019-04-13 DIAGNOSIS — E041 Nontoxic single thyroid nodule: Secondary | ICD-10-CM | POA: Diagnosis not present

## 2019-05-05 DIAGNOSIS — M25521 Pain in right elbow: Secondary | ICD-10-CM | POA: Diagnosis not present

## 2019-12-10 ENCOUNTER — Other Ambulatory Visit: Payer: Self-pay | Admitting: Family Medicine

## 2019-12-10 NOTE — Telephone Encounter (Signed)
Plz schedule cpe and lab visits.  Then return encounter to me for refills.

## 2019-12-30 NOTE — Telephone Encounter (Signed)
Patient called about her refills CPE scheduled for 03/18/20

## 2019-12-30 NOTE — Telephone Encounter (Signed)
Noted.  E-scribed refills.  Spoke with pt notifying her refills sent.

## 2020-03-01 DIAGNOSIS — L03031 Cellulitis of right toe: Secondary | ICD-10-CM | POA: Diagnosis not present

## 2020-03-08 ENCOUNTER — Ambulatory Visit
Admission: EM | Admit: 2020-03-08 | Discharge: 2020-03-08 | Disposition: A | Discharge status: Home / Self Care | Payer: BC Managed Care – PPO | Attending: Emergency Medicine | Admitting: Emergency Medicine

## 2020-03-08 ENCOUNTER — Other Ambulatory Visit: Payer: Self-pay

## 2020-03-08 ENCOUNTER — Encounter: Payer: Self-pay | Admitting: *Deleted

## 2020-03-08 DIAGNOSIS — R059 Cough, unspecified: Secondary | ICD-10-CM

## 2020-03-08 DIAGNOSIS — Z20822 Contact with and (suspected) exposure to covid-19: Secondary | ICD-10-CM

## 2020-03-08 DIAGNOSIS — Z1152 Encounter for screening for COVID-19: Secondary | ICD-10-CM | POA: Diagnosis not present

## 2020-03-08 DIAGNOSIS — R52 Pain, unspecified: Secondary | ICD-10-CM

## 2020-03-08 DIAGNOSIS — Z1211 Encounter for screening for malignant neoplasm of colon: Secondary | ICD-10-CM

## 2020-03-08 NOTE — Discharge Instructions (Signed)
Recommend take Ibuprofen 600mg  every 6 hours as needed for body aches and headache. Increase fluids to help loosen up mucus in chest. Rest. Stay at home. May take OTC Delsym or similar cough medication as needed. Encouraged to get COVID 19 vaccine when feeling better. Follow-up pending COVID 19 test results.

## 2020-03-08 NOTE — ED Triage Notes (Signed)
Patient coming in with complaints of cough, back ache, congestion and headache on yesterdayPatient's daughter brought boyfriend over who potentially had COVID-19. Patient has not taken any over the counter medications. Denies fever at this time.

## 2020-03-08 NOTE — ED Provider Notes (Signed)
[name]    CSN: [id number] Arrival date & time: 03/08/20  6160      History   Chief Complaint Chief Complaint  Patient presents with  . Cough    HPI Ashley Boone is a 49 y.o. female.   49 year old female presents with headache, back and body aches, cough, nasal and chest congestion and slight nausea for the past 2 days. Denies any distinct fever or diarrhea. Has not taken any medication for symptoms. Has exposure to COVID 57- daughter brought boyfriend over to her house about 4 days ago and he was just getting sick. His COVID test came back positive. She has not been vaccinated yet and now husband with multiple chronic health issues is also feeling ill. Other chronic health issues include hypothyroidism, hyperlipidemia, chronic nerve - burning tongue syndrome, and GERD. Currently on Synthroid, Lipitor, Gabapentin, Prilosec daily and Flonase prn.    The history is provided by the patient.    Past Medical History:  Diagnosis Date  . Depression   . GERD (gastroesophageal reflux disease)   . Hydrocephalus (HCC)    shunt ([place])  . Hypothyroidism   . Obesity     Patient Active Problem List   Diagnosis Date Noted  . Health maintenance examination 05/01/2018  . Right thyroid nodule 03/17/2018  . Burning tongue syndrome 03/17/2018  . Mixed hyperlipidemia 12/22/2016  . Hypothyroidism   . Hydrocephalus (HCC)   . GERD (gastroesophageal reflux disease)   . Depression   . Severe obesity (BMI 35.0-39.9) with comorbidity [facility])     Past Surgical History:  Procedure Laterality Date  . ANKLE SURGERY Left 1996   with plates and screws  . VENTRICULOPERITONEAL SHUNT  [phone]   with revisions, [place]    OB History   No obstetric history on file.      Home Medications    Prior to Admission medications   Medication Sig Start Date End Date Taking? Authorizing Provider  atorvastatin (LIPITOR) 20 MG tablet TAKE 1 TABLET BY MOUTH DAILY. 12/30/19   Yes Eustaquio Boyden, MD  fluticasone [facility]) 50 MCG/ACT nasal spray Place 2 sprays into both nostrils daily. 02/25/14  Yes Joaquim Nam, MD  gabapentin (NEURONTIN) 300 MG capsule Take 300 mg by mouth 3 (three) times daily. 02/12/18  Yes [provider]  levothyroxine (SYNTHROID) 150 MCG tablet TAKE 1 TABLET (150 MCG TOTAL) BY MOUTH DAILY BEFORE BREAKFAST. 12/30/19  Yes Eustaquio Boyden, MD  Multiple Vitamin (MULTIVITAMIN) tablet Take 1 tablet by mouth daily.   Yes [provider]  omeprazole (PRILOSEC) 40 MG capsule Take 40 mg by mouth 2 (two) times daily.   Yes [provider]  ferrous sulfate 324 (65 Fe) MG TBEC Take 1 tablet (325 mg total) by mouth daily. 05/02/18 03/08/20  Eustaquio Boyden, MD    Family History Family History  Problem Relation Age of Onset  . CAD Father 50       MI  . Hypertension Father   . Diabetes Father   . Cancer Neg Hx   . Stroke Neg Hx     Social History Social History   Tobacco Use  . Smoking status: Never Smoker  . Smokeless tobacco: Never Used  Substance Use Topics  . Alcohol use: No  . Drug use: No     Allergies   Patient has no known allergies.   Review of Systems Review of Systems  Constitutional: Positive for appetite change, chills and fatigue. Negative for activity change  and fever.  HENT: Positive for congestion, postnasal drip, rhinorrhea, sinus pressure and sore throat. Negative for ear discharge, ear pain, facial swelling, mouth sores, nosebleeds, sinus pain and trouble swallowing.   Eyes: Negative for pain, discharge, redness and itching.  Respiratory: Positive for cough. Negative for chest tightness, shortness of breath and wheezing.   Gastrointestinal: Positive for nausea. Negative for diarrhea and vomiting.  Musculoskeletal: Positive for arthralgias, back pain, myalgias and neck pain. Negative for neck stiffness.  Skin: Negative for color change and rash.  Allergic/Immunologic: Positive for  environmental allergies. Negative for food allergies and immunocompromised state.  Neurological: Positive for headaches. Negative for dizziness, seizures, syncope, weakness and light-headedness.  Hematological: Negative for adenopathy. Does not bruise/bleed easily.     Physical Exam Triage Vital Signs ED Triage Vitals [03/08/20 1028]  Enc Vitals Group     BP (!) 134/92     Pulse Rate 81     Resp 20     Temp 98.1 F (36.7 C)     Temp Source Oral     SpO2 97 %     Weight      Height      Head Circumference      Peak Flow      Pain Score 7     Pain Loc      Pain Edu?      Excl. in GC?    No data found.  Updated Vital Signs BP (!) 134/92 (BP Location: Left Arm)   Pulse 81   Temp 98.1 F (36.7 C) (Oral)   Resp 20   LMP  (LMP Unknown)   SpO2 97%   Visual Acuity Right Eye Distance:   Left Eye Distance:   Bilateral Distance:    Right Eye Near:   Left Eye Near:    Bilateral Near:     Physical Exam Vitals and nursing note reviewed.  Constitutional:      General: She is awake. She is not in acute distress.    Appearance: She is well-developed and well-groomed. She is ill-appearing.     Comments: She is sitting on the exam table in no acute distress but appears tired and ill.   HENT:     Head: Normocephalic and atraumatic.     Right Ear: Hearing, tympanic membrane, ear canal and external ear normal.     Left Ear: Hearing, tympanic membrane, ear canal and external ear normal.     Nose: Congestion present.     Right Sinus: No maxillary sinus tenderness or frontal sinus tenderness.     Left Sinus: No maxillary sinus tenderness or frontal sinus tenderness.     Mouth/Throat:     Lips: Pink.     Mouth: Mucous membranes are moist.     Pharynx: Oropharynx is clear. Uvula midline. No pharyngeal swelling, oropharyngeal exudate, posterior oropharyngeal erythema or uvula swelling.  Eyes:     Extraocular Movements: Extraocular movements intact.     Conjunctiva/sclera:  Conjunctivae normal.  Cardiovascular:     Rate and Rhythm: Normal rate and regular rhythm.     Heart sounds: Normal heart sounds. No murmur heard.   Pulmonary:     Effort: Pulmonary effort is normal. No respiratory distress.     Breath sounds: Normal air entry. No decreased air movement. Examination of the right-upper field reveals decreased breath sounds. Examination of the left-upper field reveals decreased breath sounds. Decreased breath sounds present. No wheezing or rhonchi.     Comments: Slight decreased breath  sounds in upper lobes. No wheezing or coarse breath sounds heard.  Musculoskeletal:        General: Normal range of motion.     Cervical back: Normal range of motion and neck supple. No rigidity.  Lymphadenopathy:     Cervical: No cervical adenopathy.  Skin:    General: Skin is warm and dry.     Capillary Refill: Capillary refill takes less than 2 seconds.  Neurological:     General: No focal deficit present.     Mental Status: She is alert and oriented to person, place, and time.  Psychiatric:        Mood and Affect: Mood normal.        Behavior: Behavior normal. Behavior is cooperative.        Thought Content: Thought content normal.        Judgment: Judgment normal.      UC Treatments / Results  Labs (all labs ordered are listed, but only abnormal results are displayed) Labs Reviewed  NOVEL CORONAVIRUS, NAA    EKG   Radiology No results found.  Procedures Procedures (including critical care time)  Medications Ordered in UC Medications - No data to display  Initial Impression / Assessment and Plan / UC Course  I have reviewed the triage vital signs and the nursing notes.  Pertinent labs & imaging results that were available during my care of the patient were reviewed by me and considered in my medical decision making (see chart for details).    Reviewed with patient that she probably has a upper respiratory viral illness- possible COVID 19.  Recommend start OTC Ibuprofen 600mg  every 6 hours as needed for body aches and headache. Increase fluids to help loosen up mucus in chest. May take OTC Delsym or similar cough medication as needed. Avoid decongestants since BP is slightly elevated. Rest. Stay at home. Encouraged for herself and her husband to get COVID 19 vaccine when feeling better. Follow-up pending COVID 19 test results.   Final Clinical Impressions(s) / UC Diagnoses   Final diagnoses:  Cough  Body aches  Exposure to COVID-19 virus     Discharge Instructions     Recommend take Ibuprofen 600mg  every 6 hours as needed for body aches and headache. Increase fluids to help loosen up mucus in chest. Rest. Stay at home. May take OTC Delsym or similar cough medication as needed. Encouraged to get COVID 19 vaccine when feeling better. Follow-up pending COVID 19 test results.     ED Prescriptions    None     PDMP not reviewed this encounter.   , NP 03/09/20 1057

## 2020-03-10 ENCOUNTER — Other Ambulatory Visit: Payer: Self-pay | Admitting: Family Medicine

## 2020-03-10 ENCOUNTER — Telehealth: Payer: Self-pay | Admitting: Family Medicine

## 2020-03-10 DIAGNOSIS — E039 Hypothyroidism, unspecified: Secondary | ICD-10-CM

## 2020-03-10 DIAGNOSIS — E782 Mixed hyperlipidemia: Secondary | ICD-10-CM

## 2020-03-10 LAB — SARS-COV-2, NAA 2 DAY TAT

## 2020-03-10 LAB — NOVEL CORONAVIRUS, NAA: SARS-CoV-2, NAA: DETECTED — AB

## 2020-03-10 NOTE — Telephone Encounter (Signed)
Reviewing chart for lab orders I noted pt was just diagnosed with COVID at [facility] on 03/08/2020.  Will need to reschedule lab appt tomorrow and CPE next week.  plz call for update - when was first day of symptoms and how is she feeling? Would offer mAb infusion treatment.

## 2020-03-11 ENCOUNTER — Other Ambulatory Visit: Payer: BC Managed Care – PPO

## 2020-03-11 NOTE — Telephone Encounter (Addendum)
Lvm asking pt to call back.  Need to r/s lab and cpe appts.  Also, need to find out 1st day of sxs and get an update on sxs.

## 2020-03-11 NOTE — Telephone Encounter (Addendum)
Would offer virtual if desired for next week, with ER precautions.

## 2020-03-11 NOTE — Telephone Encounter (Addendum)
Spoke with asking how she is feeling.  States she is feeling "lousy".  C/o fatigue, diarrhea, body aches cough and loss of taste/smell.  Sxs started 03/06/20.  Pt is not vaccinated and declines infusion at this time.  Per Zella Ball, lab visit r/s.  Pt will call back to r/s cpe.   Added pt to Call Log.

## 2020-03-14 NOTE — Telephone Encounter (Addendum)
Noted. plz call again on Wednesday for update on symptoms.  Did husband have covid too?

## 2020-03-14 NOTE — Telephone Encounter (Signed)
Spoke with pt asking for an update on sxs.  States she's feeling much better.  Still c/o mild fatigue.  But cough, taste/smell and especially the body aches have improved.

## 2020-03-16 NOTE — Telephone Encounter (Signed)
Noted. As doing well and 10 days from symptoms, ok to stop following.

## 2020-03-16 NOTE — Telephone Encounter (Signed)
Noted  

## 2020-03-16 NOTE — Telephone Encounter (Signed)
Spoke with pt asking for an update.  Says today she is feeling much better.  The fatigue has greatly improved.  Still has a little cough but it is improving also. [see TE, 03/16/20 concerning pt's husband, Willette Alma Coffie]

## 2020-03-18 ENCOUNTER — Encounter: Payer: BC Managed Care – PPO | Admitting: Family Medicine

## 2020-07-04 ENCOUNTER — Other Ambulatory Visit: Payer: Self-pay | Admitting: Family Medicine

## 2020-07-19 ENCOUNTER — Other Ambulatory Visit: Payer: Self-pay | Admitting: Family Medicine

## 2020-07-21 ENCOUNTER — Other Ambulatory Visit (INDEPENDENT_AMBULATORY_CARE_PROVIDER_SITE_OTHER): Payer: BC Managed Care – PPO

## 2020-07-21 ENCOUNTER — Other Ambulatory Visit: Payer: Self-pay

## 2020-07-21 DIAGNOSIS — E782 Mixed hyperlipidemia: Secondary | ICD-10-CM

## 2020-07-21 DIAGNOSIS — E039 Hypothyroidism, unspecified: Secondary | ICD-10-CM | POA: Diagnosis not present

## 2020-07-21 LAB — T4, FREE: Free T4: 1.08 ng/dL (ref 0.60–1.60)

## 2020-07-21 LAB — COMPREHENSIVE METABOLIC PANEL
ALT: 35 U/L (ref 0–35)
AST: 21 U/L (ref 0–37)
Albumin: 4.5 g/dL (ref 3.5–5.2)
Alkaline Phosphatase: 65 U/L (ref 39–117)
BUN: 13 mg/dL (ref 6–23)
CO2: 29 mEq/L (ref 19–32)
Calcium: 9.7 mg/dL (ref 8.4–10.5)
Chloride: 107 mEq/L (ref 96–112)
Creatinine, Ser: 0.88 mg/dL (ref 0.40–1.20)
GFR: 76.65 mL/min (ref >60.00)
Glucose, Bld: 103 mg/dL — ABNORMAL HIGH (ref 70–99)
Potassium: 4.7 mEq/L (ref 3.5–5.1)
Sodium: 142 mEq/L (ref 135–145)
Total Bilirubin: 0.5 mg/dL (ref 0.2–1.2)
Total Protein: 7.1 g/dL (ref 6.0–8.3)

## 2020-07-21 LAB — LIPID PANEL
Cholesterol: 228 mg/dL — ABNORMAL HIGH (ref 0–200)
HDL: 40 mg/dL (ref >39.00)
NonHDL: 188.41
Total CHOL/HDL Ratio: 6
Triglycerides: 389 mg/dL — ABNORMAL HIGH (ref 0.0–149.0)
VLDL: 77.8 mg/dL — ABNORMAL HIGH (ref 0.0–40.0)

## 2020-07-21 LAB — LDL CHOLESTEROL, DIRECT: Direct LDL: 126 mg/dL

## 2020-07-21 LAB — TSH: TSH: 0.79 u[IU]/mL (ref 0.35–4.50)

## 2020-08-02 ENCOUNTER — Other Ambulatory Visit: Payer: Self-pay

## 2020-08-02 ENCOUNTER — Ambulatory Visit (INDEPENDENT_AMBULATORY_CARE_PROVIDER_SITE_OTHER): Payer: BC Managed Care – PPO | Admitting: Family Medicine

## 2020-08-02 ENCOUNTER — Encounter: Payer: Self-pay | Admitting: Family Medicine

## 2020-08-02 VITALS — BP 122/84 | HR 69 | Temp 98.0°F | Ht 66.5 in | Wt 231.1 lb

## 2020-08-02 DIAGNOSIS — E039 Hypothyroidism, unspecified: Secondary | ICD-10-CM | POA: Diagnosis not present

## 2020-08-02 DIAGNOSIS — Z23 Encounter for immunization: Secondary | ICD-10-CM

## 2020-08-02 DIAGNOSIS — G919 Hydrocephalus, unspecified: Secondary | ICD-10-CM

## 2020-08-02 DIAGNOSIS — Z0000 Encounter for general adult medical examination without abnormal findings: Secondary | ICD-10-CM | POA: Diagnosis not present

## 2020-08-02 DIAGNOSIS — E782 Mixed hyperlipidemia: Secondary | ICD-10-CM

## 2020-08-02 DIAGNOSIS — E041 Nontoxic single thyroid nodule: Secondary | ICD-10-CM

## 2020-08-02 DIAGNOSIS — Z1211 Encounter for screening for malignant neoplasm of colon: Secondary | ICD-10-CM | POA: Diagnosis not present

## 2020-08-02 DIAGNOSIS — K146 Glossodynia: Secondary | ICD-10-CM

## 2020-08-02 MED ORDER — LEVOTHYROXINE SODIUM 150 MCG PO TABS
150.0000 ug | ORAL_TABLET | Freq: Every day | ORAL | 3 refills | Status: DC
Start: 2020-08-02 — End: 2021-08-02

## 2020-08-02 MED ORDER — ATORVASTATIN CALCIUM 20 MG PO TABS
20.0000 mg | ORAL_TABLET | Freq: Every day | ORAL | 3 refills | Status: DC
Start: 2020-08-02 — End: 2021-08-29

## 2020-08-02 NOTE — Patient Instructions (Addendum)
Consider shingrix vaccine.  Flu shot today  Pass by lab to pick up stool kit.  Work on regular exercise routine.  Return as needed or in 1 year for next physical.   Health Maintenance, Female Adopting a healthy lifestyle and getting preventive care are important in promoting health and wellness. Ask your health care provider about:  The right schedule for you to have regular tests and exams.  Things you can do on your own to prevent diseases and keep yourself healthy. What should I know about diet, weight, and exercise? Eat a healthy diet  Eat a diet that includes plenty of vegetables, fruits, low-fat dairy products, and lean protein.  Do not eat a lot of foods that are high in solid fats, added sugars, or sodium.   Maintain a healthy weight Body mass index (BMI) is used to identify weight problems. It estimates body fat based on height and weight. Your health care provider can help determine your BMI and help you achieve or maintain a healthy weight. Get regular exercise Get regular exercise. This is one of the most important things you can do for your health. Most adults should:  Exercise for at least 150 minutes each week. The exercise should increase your heart rate and make you sweat (moderate-intensity exercise).  Do strengthening exercises at least twice a week. This is in addition to the moderate-intensity exercise.  Spend less time sitting. Even light physical activity can be beneficial. Watch cholesterol and blood lipids Have your blood tested for lipids and cholesterol at 51 years of age, then have this test every 5 years. Have your cholesterol levels checked more often if:  Your lipid or cholesterol levels are high.  You are older than 51 years of age.  You are at high risk for heart disease. What should I know about cancer screening? Depending on your health history and family history, you may need to have cancer screening at various ages. This may include screening  for:  Breast cancer.  Cervical cancer.  Colorectal cancer.  Skin cancer.  Lung cancer. What should I know about heart disease, diabetes, and high blood pressure? Blood pressure and heart disease  High blood pressure causes heart disease and increases the risk of stroke. This is more likely to develop in people who have high blood pressure readings, are of African descent, or are overweight.  Have your blood pressure checked: ? Every 3-5 years if you are 64-68 years of age. ? Every year if you are 68 years old or older. Diabetes Have regular diabetes screenings. This checks your fasting blood sugar level. Have the screening done:  Once every three years after age 73 if you are at a normal weight and have a low risk for diabetes.  More often and at a younger age if you are overweight or have a high risk for diabetes. What should I know about preventing infection? Hepatitis B If you have a higher risk for hepatitis B, you should be screened for this virus. Talk with your health care provider to find out if you are at risk for hepatitis B infection. Hepatitis C Testing is recommended for:  Everyone born from [birth year] through 1966.  Anyone with known risk factors for hepatitis C. Sexually transmitted infections (STIs)  Get screened for STIs, including gonorrhea and chlamydia, if: ? You are sexually active and are younger than 51 years of age. ? You are older than 51 years of age and your health care provider tells you that  you are at risk for this type of infection. ? Your sexual activity has changed since you were last screened, and you are at increased risk for chlamydia or gonorrhea. Ask your health care provider if you are at risk.  Ask your health care provider about whether you are at high risk for HIV. Your health care provider may recommend a prescription medicine to help prevent HIV infection. If you choose to take medicine to prevent HIV, you should first get tested for HIV.  You should then be tested every 3 months for as long as you are taking the medicine. Pregnancy  If you are about to stop having your period (premenopausal) and you may become pregnant, seek counseling before you get pregnant.  Take 400 to 800 micrograms (mcg) of folic acid every day if you become pregnant.  Ask for birth control (contraception) if you want to prevent pregnancy. Osteoporosis and menopause Osteoporosis is a disease in which the bones lose minerals and strength with aging. This can result in bone fractures. If you are 1 years old or older, or if you are at risk for osteoporosis and fractures, ask your health care provider if you should:  Be screened for bone loss.  Take a calcium or vitamin D supplement to lower your risk of fractures.  Be given hormone replacement therapy (HRT) to treat symptoms of menopause. Follow these instructions at home: Lifestyle  Do not use any products that contain nicotine or tobacco, such as cigarettes, e-cigarettes, and chewing tobacco. If you need help quitting, ask your health care provider.  Do not use street drugs.  Do not share needles.  Ask your health care provider for help if you need support or information about quitting drugs. Alcohol use  Do not drink alcohol if: ? Your health care provider tells you not to drink. ? You are pregnant, may be pregnant, or are planning to become pregnant.  If you drink alcohol: ? Limit how much you use to 0-1 drink a day. ? Limit intake if you are breastfeeding.  Be aware of how much alcohol is in your drink. In the [place] one drink equals one 12 oz bottle of beer (355 mL), one 5 oz glass of wine (148 mL), or one 1 oz glass of hard liquor (44 mL). General instructions  Schedule regular health, dental, and eye exams.  Stay current with your vaccines.  Tell your health care provider if: ? You often feel depressed. ? You have ever been abused or do not feel safe at  home. Summary  Adopting a healthy lifestyle and getting preventive care are important in promoting health and wellness.  Follow your health care provider's instructions about healthy diet, exercising, and getting tested or screened for diseases.  Follow your health care provider's instructions on monitoring your cholesterol and blood pressure. This information is not intended to replace advice given to you by your health care provider. Make sure you discuss any questions you have with your health care provider. Document Revised: 05/28/2018 Document Reviewed: 05/28/2018 Elsevier Patient Education  2021 [employer].

## 2020-08-02 NOTE — Assessment & Plan Note (Signed)
H/o this, has shunt in place. Stable period.

## 2020-08-02 NOTE — Assessment & Plan Note (Signed)
Preventative protocols reviewed and updated unless pt declined. Discussed healthy diet and lifestyle.  

## 2020-08-02 NOTE — Assessment & Plan Note (Signed)
Chronic, triglycerides remain uncontrolled despite atorvastatin. Pt does not like seafood. Consider increasing atorvastatin dose vs fibrate. She does like grapefruit - caution with cytochrome P450 interactions (ie statin).  The 10-year ASCVD risk score Ashley Boone., et al., 2013) is: 2%   Values used to calculate the score:     Age: 50 years     Sex: Female     Is Non-Hispanic African American: No     Diabetic: No     Tobacco smoker: No     Systolic Blood Pressure: 122 mmHg     Is BP treated: No     HDL Cholesterol: 40 mg/dL     Total Cholesterol: 228 mg/dL

## 2020-08-02 NOTE — Assessment & Plan Note (Signed)
Diagnosed and managed by ENT on PPI and gabapentin.

## 2020-08-02 NOTE — Assessment & Plan Note (Signed)
Chronic, stable on current regimen.  

## 2020-08-02 NOTE — Assessment & Plan Note (Signed)
Stable period. Continue to monitor. Consider updated thyroid imaging over the next few years.

## 2020-08-02 NOTE — Progress Notes (Signed)
Patient ID: Ashley Boone, female    DOB: 12/20/1970, 50 y.o.   MRN: [id number]  This visit was conducted in person.  BP 122/84   Pulse 69   Temp 98 F (36.7 C) (Temporal)   Ht 5' 6.5" (1.689 m)   Wt 231 lb 2 oz (104.8 kg)   LMP  (Within Years)   SpO2 98%   BMI 36.75 kg/m    CC: CPE Subjective:   HPI: Ashley Boone is a 50 y.o. female presenting on 08/02/2020 for Annual Exam (Gettine Pap and mammogram tomorrow. )   New grandmother!  COVID infection 02/2020 - fully recovered.  Burning tongue on gabapentin 353m tid and omeprazole 476mbid, [facility]. Needs higher dose to control symptoms.   Preventative: Colon cancer screening - discussed options, would like to start with iFOB Breast cancer screening - 2018 normal mammogram - rpt pending tomorrow  Well woman exam with OBGYN Dr HeUlanda Edisonith normal paps - rpt appt scheduled tomorrow  Flu shot - yearly COVID vaccine - discussed, declines  Tetanus shot - unsure. Tdap ?2019 - not documented  Shingrix - discussed  Seat belt use discussed  Sunscreen use discussed. No changing moles on skin  Non smoker  Alcohol - none  Dentist q6 mo  Eye exam - yearly   Lives with and son (51yo daughter (51yo daughter ([id number] and FIL ([id number]: self employed - [employer]  Activity: walks dog  Diet: good water, fruits/vegetables daily, dr pepper       Relevant past medical, surgical, family and social history reviewed and updated as indicated. Interim medical history since our last visit reviewed. Allergies and medications reviewed and updated. Outpatient Medications Prior to Visit  Medication Sig Dispense Refill  . fluticasone (FLONASE) 50 MCG/ACT nasal spray Place 2 sprays into both nostrils daily. (Patient taking differently: Place 2 sprays into both nostrils daily. As needed)    . gabapentin (NEURONTIN) 300 MG capsule Take 300 mg by mouth 3 (three) times daily.  5  .  Multiple Vitamin (MULTIVITAMIN) tablet Take 1 tablet by mouth daily.    . Marland Kitchenmeprazole (PRILOSEC) 40 MG capsule Take 40 mg by mouth 2 (two) times daily.    . Marland Kitchentorvastatin (LIPITOR) 20 MG tablet TAKE 1 TABLET BY MOUTH DAILY. 30 tablet 0  . levothyroxine (SYNTHROID) 150 MCG tablet TAKE 1 TABLET (150 MCG TOTAL) BY MOUTH DAILY BEFORE BREAKFAST. 30 tablet 0   No facility-administered medications prior to visit.     Per HPI unless specifically indicated in ROS section below Review of Systems  Constitutional: Negative for activity change, appetite change, chills, fatigue, fever and unexpected weight change.  HENT: Negative for hearing loss.   Eyes: Negative for visual disturbance.  Respiratory: Negative for cough, chest tightness, shortness of breath and wheezing.   Cardiovascular: Negative for chest pain, palpitations and leg swelling.  Gastrointestinal: Negative for abdominal distention, abdominal pain, blood in stool, constipation, diarrhea, nausea and vomiting.  Genitourinary: Negative for difficulty urinating and hematuria.  Musculoskeletal: Negative for arthralgias, myalgias and neck pain.  Skin: Negative for rash.  Neurological: Positive for headaches. Negative for dizziness, seizures and syncope.  Hematological: Negative for adenopathy. Bruises/bleeds easily.  Psychiatric/Behavioral: Negative for dysphoric mood. The patient is not nervous/anxious.    Objective:  BP 122/84   Pulse 69   Temp 98 F (36.7 C) (Temporal)   Ht 5' 6.5" (1.689 m)   Wt 231 lb  2 oz (104.8 kg)   LMP  (Within Years)   SpO2 98%   BMI 36.75 kg/m   Wt Readings from Last 3 Encounters:  08/02/20 231 lb 2 oz (104.8 kg)  05/02/18 228 lb (103.4 kg)  03/17/18 226 lb 4 oz (102.6 kg)      Physical Exam Vitals and nursing note reviewed.  Constitutional:      General: She is not in acute distress.    Appearance: Normal appearance. She is well-developed and well-nourished. She is not ill-appearing.  HENT:     Head:  Normocephalic and atraumatic.     Right Ear: Hearing, tympanic membrane, ear canal and external ear normal.     Left Ear: Hearing, tympanic membrane, ear canal and external ear normal.     Mouth/Throat:     Mouth: Oropharynx is clear and moist and mucous membranes are normal.     Pharynx: Uvula midline. No posterior oropharyngeal edema.  Eyes:     General: No scleral icterus.    Extraocular Movements: EOM normal.     Conjunctiva/sclera: Conjunctivae normal.     Pupils: Pupils are equal, round, and reactive to light.  Neck:     Thyroid: Thyroid mass (small R sided, nontender) present. No thyromegaly or thyroid tenderness.  Cardiovascular:     Rate and Rhythm: Normal rate and regular rhythm.     Pulses: Normal pulses and intact distal pulses.          Radial pulses are 2+ on the right side and 2+ on the left side.     Heart sounds: Normal heart sounds. No murmur heard.   Pulmonary:     Effort: Pulmonary effort is normal. No respiratory distress.     Breath sounds: Normal breath sounds. No wheezing, rhonchi or rales.  Abdominal:     General: Abdomen is flat. Bowel sounds are normal. There is no distension.     Palpations: Abdomen is soft. There is no mass.     Tenderness: There is no abdominal tenderness. There is no guarding or rebound.     Hernia: No hernia is present.  Musculoskeletal:        General: No edema. Normal range of motion.     Cervical back: Normal range of motion and neck supple.     Right lower leg: No edema.     Left lower leg: No edema.  Lymphadenopathy:     Cervical: No cervical adenopathy.  Skin:    General: Skin is warm and dry.     Findings: No rash.  Neurological:     General: No focal deficit present.     Mental Status: She is alert and oriented to person, place, and time.     Comments: CN grossly intact, station and gait intact  Psychiatric:        Mood and Affect: Mood and affect and mood normal.        Behavior: Behavior normal.        Thought  Content: Thought content normal.        Judgment: Judgment normal.       Results for orders placed or performed in visit on 07/21/20  T4, free  Result Value Ref Range   Free T4 1.08 0.60 - 1.60 ng/dL  TSH  Result Value Ref Range   TSH 0.79 0.35 - 4.50 uIU/mL  Comprehensive metabolic panel  Result Value Ref Range   Sodium 142 135 - 145 mEq/L   Potassium 4.7 3.5 - 5.1 mEq/L  Chloride 107 96 - 112 mEq/L   CO2 29 19 - 32 mEq/L   Glucose, Bld 103 (H) 70 - 99 mg/dL   BUN 13 6 - 23 mg/dL   Creatinine, Ser 0.88 0.40 - 1.20 mg/dL   Total Bilirubin 0.5 0.2 - 1.2 mg/dL   Alkaline Phosphatase 65 39 - 117 U/L   AST 21 0 - 37 U/L   ALT 35 0 - 35 U/L   Total Protein 7.1 6.0 - 8.3 g/dL   Albumin 4.5 3.5 - 5.2 g/dL   GFR 76.65 >60.00 mL/min   Calcium 9.7 8.4 - 10.5 mg/dL  Lipid panel  Result Value Ref Range   Cholesterol 228 (H) 0 - 200 mg/dL   Triglycerides 389.0 (H) 0.0 - 149.0 mg/dL   HDL 40.00 >39.00 mg/dL   VLDL 77.8 (H) 0.0 - 40.0 mg/dL   Total CHOL/HDL Ratio 6    NonHDL 188.41   LDL cholesterol, direct  Result Value Ref Range   Direct LDL 126.0 mg/dL   Assessment & Plan:  This visit occurred during the SARS-CoV-2 public health emergency.  Safety protocols were in place, including screening questions prior to the visit, additional usage of staff PPE, and extensive cleaning of exam room while observing appropriate contact time as indicated for disinfecting solutions.   Problem List Items Addressed This Visit    Right thyroid nodule    Stable period. Continue to monitor. Consider updated thyroid imaging over the next few years.       Relevant Medications   levothyroxine (SYNTHROID) 150 MCG tablet   Mixed hyperlipidemia    Chronic, triglycerides remain uncontrolled despite atorvastatin. Pt does not like seafood. Consider increasing atorvastatin dose vs fibrate. She does like grapefruit - caution with cytochrome P450 interactions (ie statin).  The 10-year ASCVD risk score Mikey Bussing  DC Brooke Bonito., et al., 2013) is: 2%   Values used to calculate the score:     Age: 21 years     Sex: Female     Is Non-Hispanic African American: No     Diabetic: No     Tobacco smoker: No     Systolic Blood Pressure: 413 mmHg     Is BP treated: No     HDL Cholesterol: 40 mg/dL     Total Cholesterol: 228 mg/dL       Relevant Medications   atorvastatin (LIPITOR) 20 MG tablet   Hypothyroidism    Chronic, stable on current regimen.       Relevant Medications   levothyroxine (SYNTHROID) 150 MCG tablet   Hydrocephalus (HCC)    H/o this, has shunt in place. Stable period.       Health maintenance examination - Primary    Preventative protocols reviewed and updated unless pt declined. Discussed healthy diet and lifestyle.       Burning tongue syndrome    Diagnosed and managed by ENT on PPI and gabapentin.        Other Visit Diagnoses    Need for influenza vaccination       Relevant Orders   Flu Vaccine QUAD 36+ mos IM (Completed)   Special screening for malignant neoplasms, colon       Relevant Orders   Fecal occult blood, imunochemical       Meds ordered this encounter  Medications  . levothyroxine (SYNTHROID) 150 MCG tablet    Sig: Take 1 tablet (150 mcg total) by mouth daily before breakfast.    Dispense:  90 tablet  Refill:  3  . atorvastatin (LIPITOR) 20 MG tablet    Sig: Take 1 tablet (20 mg total) by mouth daily.    Dispense:  90 tablet    Refill:  3   Orders Placed This Encounter  Procedures  . Fecal occult blood, imunochemical    Standing Status:   Future    Standing Expiration Date:   08/02/2021  . Flu Vaccine QUAD 36+ mos IM    Patient instructions: Consider shingrix vaccine.  Flu shot today  Pass by lab to pick up stool kit.  Work on regular exercise routine.  Return as needed or in 1 year for next physical.   Follow up plan: Return in about 1 year (around 08/02/2021) for annual exam, prior fasting for blood work.  Ria Bush, MD

## 2020-08-03 DIAGNOSIS — Z1231 Encounter for screening mammogram for malignant neoplasm of breast: Secondary | ICD-10-CM | POA: Diagnosis not present

## 2020-08-03 DIAGNOSIS — Z130 Encounter for screening for diseases of the blood and blood-forming organs and certain disorders involving the immune mechanism: Secondary | ICD-10-CM | POA: Diagnosis not present

## 2020-08-03 DIAGNOSIS — E669 Obesity, unspecified: Secondary | ICD-10-CM | POA: Diagnosis not present

## 2020-08-03 DIAGNOSIS — Z124 Encounter for screening for malignant neoplasm of cervix: Secondary | ICD-10-CM | POA: Diagnosis not present

## 2020-08-03 DIAGNOSIS — Z1389 Encounter for screening for other disorder: Secondary | ICD-10-CM | POA: Diagnosis not present

## 2020-08-03 DIAGNOSIS — Z01419 Encounter for gynecological examination (general) (routine) without abnormal findings: Secondary | ICD-10-CM | POA: Diagnosis not present

## 2020-08-18 ENCOUNTER — Other Ambulatory Visit (INDEPENDENT_AMBULATORY_CARE_PROVIDER_SITE_OTHER): Payer: BC Managed Care – PPO

## 2020-08-18 DIAGNOSIS — Z1211 Encounter for screening for malignant neoplasm of colon: Secondary | ICD-10-CM

## 2020-08-18 LAB — FECAL OCCULT BLOOD, GUAIAC: Fecal Occult Blood: NEGATIVE

## 2020-08-18 LAB — FECAL OCCULT BLOOD, IMMUNOCHEMICAL: Fecal Occult Bld: NEGATIVE

## 2020-08-19 ENCOUNTER — Encounter: Payer: Self-pay | Admitting: Family Medicine

## 2021-04-28 ENCOUNTER — Encounter: Payer: Self-pay | Admitting: Family Medicine

## 2021-04-28 ENCOUNTER — Telehealth (INDEPENDENT_AMBULATORY_CARE_PROVIDER_SITE_OTHER): Payer: BC Managed Care – PPO | Admitting: Family Medicine

## 2021-04-28 ENCOUNTER — Other Ambulatory Visit: Payer: Self-pay

## 2021-04-28 VITALS — Temp 98.9°F | Ht 65.5 in

## 2021-04-28 DIAGNOSIS — J019 Acute sinusitis, unspecified: Secondary | ICD-10-CM | POA: Diagnosis not present

## 2021-04-28 DIAGNOSIS — G919 Hydrocephalus, unspecified: Secondary | ICD-10-CM | POA: Diagnosis not present

## 2021-04-28 MED ORDER — FLUTICASONE PROPIONATE 50 MCG/ACT NA SUSP: 2.0000 | Freq: Every day | NASAL | 6 refills | Status: AC | PRN

## 2021-04-28 MED ORDER — AMOXICILLIN-POT CLAVULANATE 875-125 MG PO TABS: 1.0000 | ORAL_TABLET | Freq: Two times a day (BID) | ORAL | 0 refills | Status: AC

## 2021-04-28 NOTE — Assessment & Plan Note (Signed)
VP shunt in place.

## 2021-04-28 NOTE — Assessment & Plan Note (Addendum)
Given duration of illness, will treat with augmentin course.  rec restart flonase as well.  Further supportive care measures reviewed.  Monitor diarrhea on antibiotic, discussed probiotic use.  Update if not improving with treatment.  Pt agrees with plan.

## 2021-04-28 NOTE — Progress Notes (Signed)
Patient ID: Ashley Boone, female    DOB: 03/09/1971, 50 y.o.   MRN: [id number]  Virtual visit completed through MyChart, a video enabled telemedicine application. Due to national recommendations of social distancing due to COVID-19, a virtual visit is felt to be most appropriate for this patient at this time. Reviewed limitations, risks, security and privacy concerns of performing a virtual visit and the availability of in person appointments. I also reviewed that there may be a patient responsible charge related to this service. The patient agreed to proceed.   Patient location: home Provider location: [place] [facility], office Persons participating in this virtual visit: patient, provider   If any vitals were documented, they were collected by patient at home unless specified below.    Temp 98.9 F (37.2 C)   Ht 5' 5.5" (1.664 m)   LMP  (Within Years)   BMI 37.88 kg/m   BP Readings from Last 3 Encounters:  08/02/20 122/84  03/08/20 (!) 134/92  05/02/18 124/80    CC: sinus symptoms Subjective:   HPI: Ashley Boone is a 50 y.o. female presenting on 04/28/2021 for Sinus Problem (C/o cough, facial pain and sinus pressure. Sxs started 04/24/21.  Also, c/o diarrhea. )   3-4 d h/o bilateral facial pain at forehead and under eyes R>L, mildly productive mucous, coughing worsens head pressure, head > chest congestion. Watery diarrhea for 3 days as well as decreased appetite. 4+ BM/day. No blood in stool.  Last week had ST for 3 days (about 10 days ago). Known chronic hydrocephalus s/p VP shunt. Pain is on same side as brain hardware.   No fevers/chills, ear or tooth pain, ST, PNDrainage, abd pain, nausea. No dyspnea or wheezing.   Treating with sudafed. She has not been taking flonase.  No sick contacts at home.  No smokers at home.  No h/o asthma.  She doesn't have home COVID swab.      Relevant past medical, surgical, family and social history reviewed and  updated as indicated. Interim medical history since our last visit reviewed. Allergies and medications reviewed and updated. Outpatient Medications Prior to Visit  Medication Sig Dispense Refill   atorvastatin (LIPITOR) 20 MG tablet Take 1 tablet (20 mg total) by mouth daily. 90 tablet 3   gabapentin (NEURONTIN) 300 MG capsule Take 300 mg by mouth 3 (three) times daily.  5   levothyroxine (SYNTHROID) 150 MCG tablet Take 1 tablet (150 mcg total) by mouth daily before breakfast. 90 tablet 3   Multiple Vitamin (MULTIVITAMIN) tablet Take 1 tablet by mouth daily.     omeprazole (PRILOSEC) 40 MG capsule Take 40 mg by mouth 2 (two) times daily.     fluticasone (FLONASE) 50 MCG/ACT nasal spray Place 2 sprays into both nostrils daily. (Patient taking differently: Place 2 sprays into both nostrils daily. As needed)     No facility-administered medications prior to visit.     Per HPI unless specifically indicated in ROS section below Review of Systems Objective:  Temp 98.9 F (37.2 C)   Ht 5' 5.5" (1.664 m)   LMP  (Within Years)   BMI 37.88 kg/m   Wt Readings from Last 3 Encounters:  08/02/20 231 lb 2 oz (104.8 kg)  05/02/18 228 lb (103.4 kg)  03/17/18 226 lb 4 oz (102.6 kg)       Physical exam: Gen: alert, NAD, not ill appearing Pulm: speaks in complete sentences without increased work of breathing Psych: normal mood, normal  thought content      Results for orders placed or performed in visit on 08/19/20  Fecal Occult Blood, Guaiac  Result Value Ref Range   Fecal Occult Blood Negative    Assessment & Plan:   Problem List Items Addressed This Visit     Hydrocephalus (HCC)    VP shunt in place.       Acute sinusitis - Primary    Given duration of illness, will treat with augmentin course.  rec restart flonase as well.  Further supportive care measures reviewed.  Monitor diarrhea on antibiotic, discussed probiotic use.  Update if not improving with treatment.  Pt agrees with  plan.       Relevant Medications   amoxicillin-clavulanate (AUGMENTIN) 875-125 MG tablet   fluticasone (FLONASE) 50 MCG/ACT nasal spray     Meds ordered this encounter  Medications   amoxicillin-clavulanate (AUGMENTIN) 875-125 MG tablet    Sig: Take 1 tablet by mouth 2 (two) times daily for 10 days.    Dispense:  20 tablet    Refill:  0   fluticasone (FLONASE) 50 MCG/ACT nasal spray    Sig: Place 2 sprays into both nostrils daily as needed for allergies.    Dispense:  16 g    Refill:  6    No orders of the defined types were placed in this encounter.   I discussed the assessment and treatment plan with the patient. The patient was provided an opportunity to ask questions and all were answered. The patient agreed with the plan and demonstrated an understanding of the instructions. The patient was advised to call back or seek an in-person evaluation if the symptoms worsen or if the condition fails to improve as anticipated.  Follow up plan: Return if symptoms worsen or fail to improve.  Eustaquio Boyden, MD

## 2021-07-31 ENCOUNTER — Other Ambulatory Visit: Payer: Self-pay | Admitting: Family Medicine

## 2021-08-28 ENCOUNTER — Other Ambulatory Visit: Payer: Self-pay | Admitting: Family Medicine

## 2021-10-12 IMAGING — US US THYROID
1 series · 13 of 25 positions shown · non-contrast
Comparison: 03/21/2018

CLINICAL DATA: 48-year-old female with a history of right thyroid
nodule

EXAM:
THYROID ULTRASOUND
TECHNIQUE: Ultrasound examination of the thyroid gland and adjacent soft
tissues was performed.

[Series 1: us thyroid · 0.05mm/px · 13 of 45 slices shown]
[im 1/45]
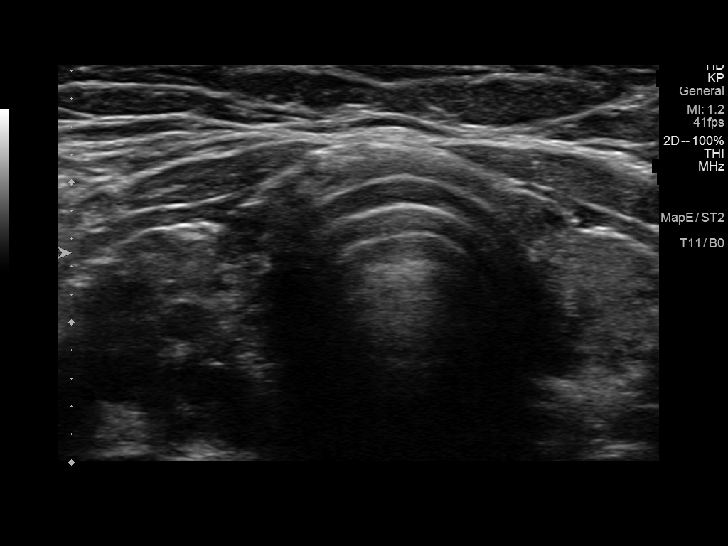
[im 4/45]
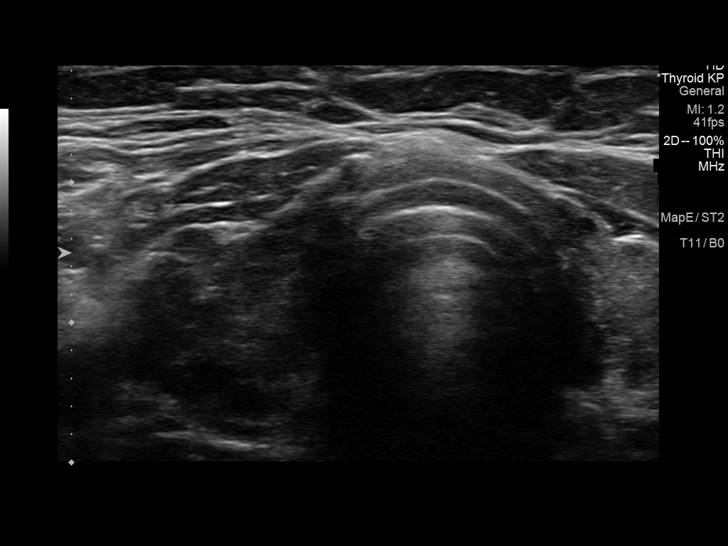
[im 8/45]
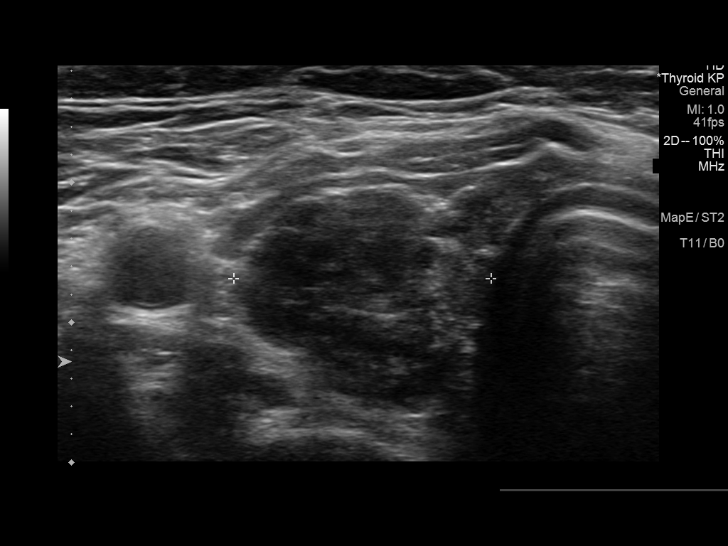
[im 12/45]
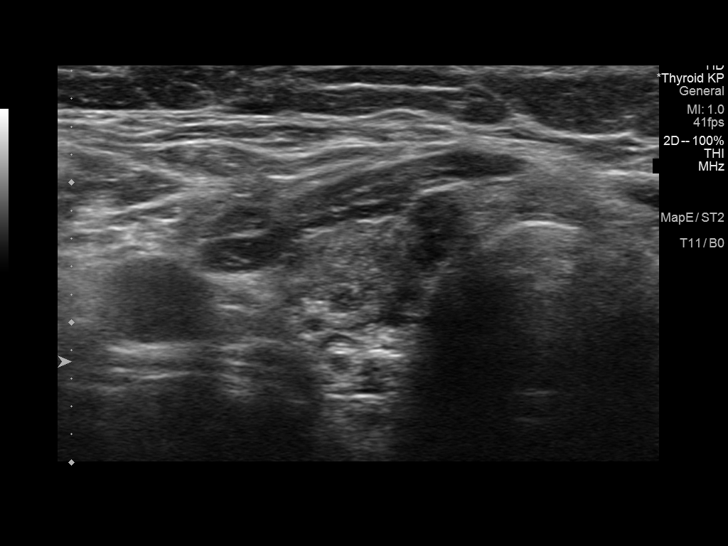
[im 15/45]
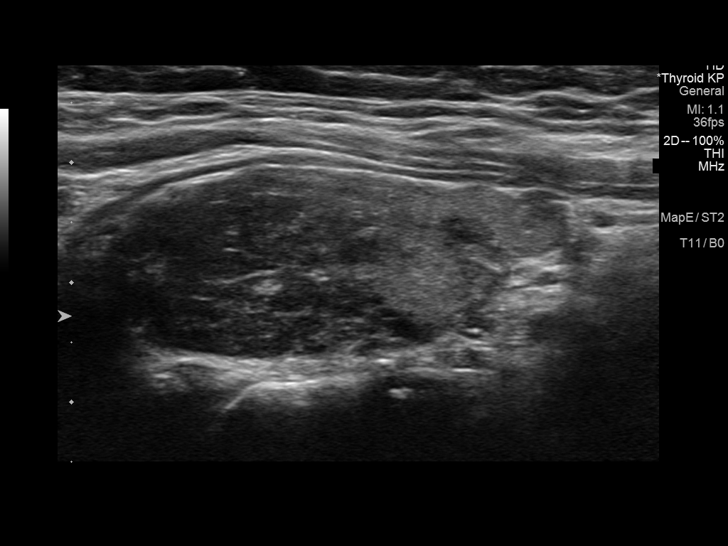
[im 19/45]
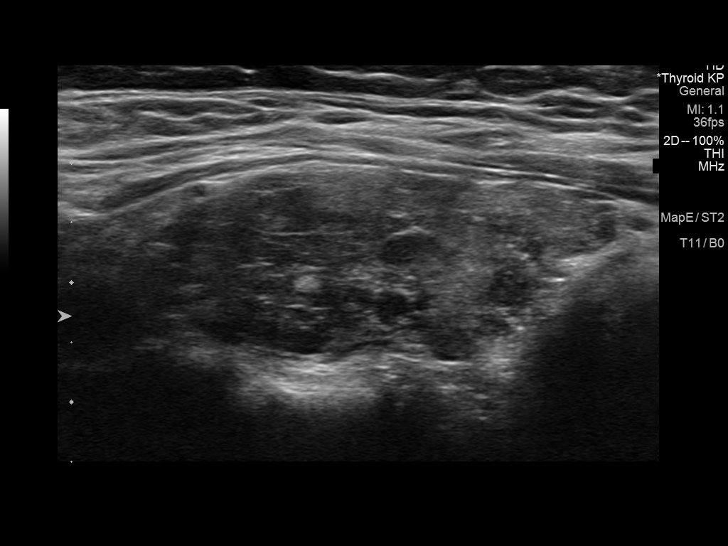
[im 23/45]
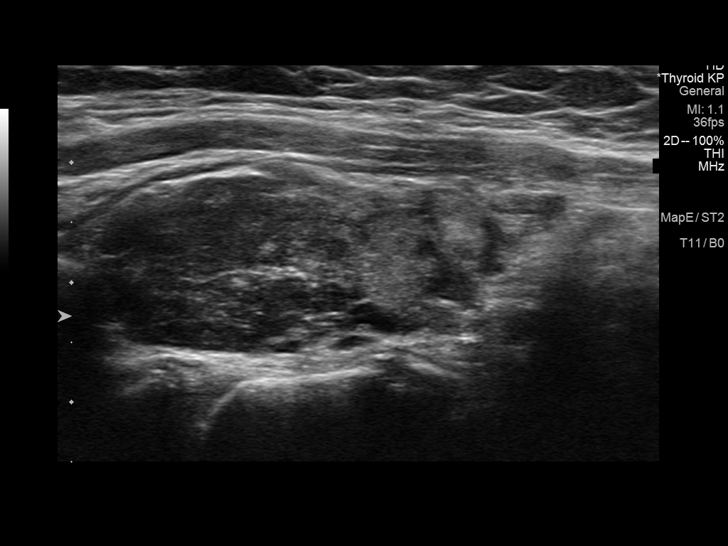
[im 26/45]
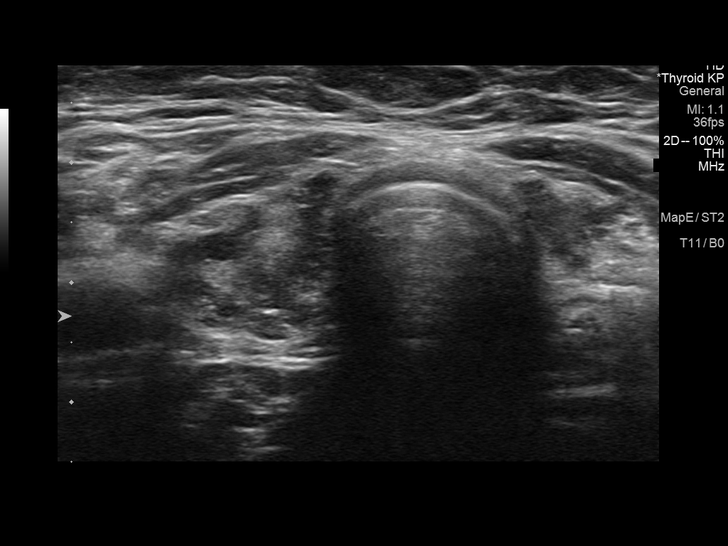
[im 30/45]
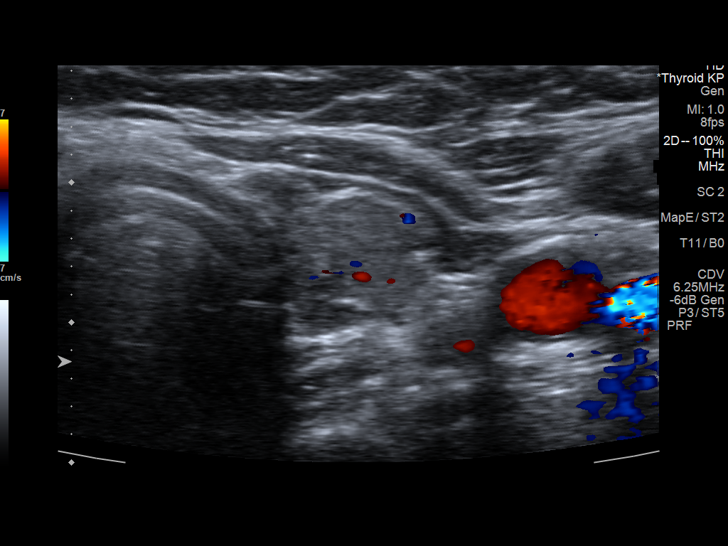
[im 34/45]
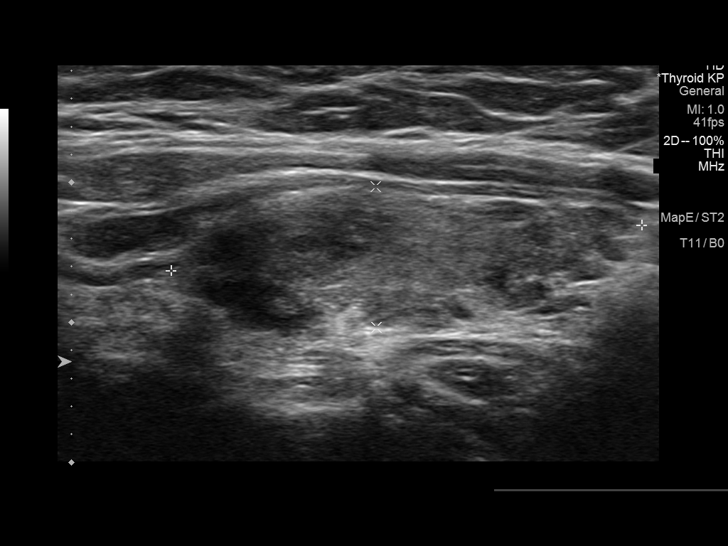
[im 37/45]
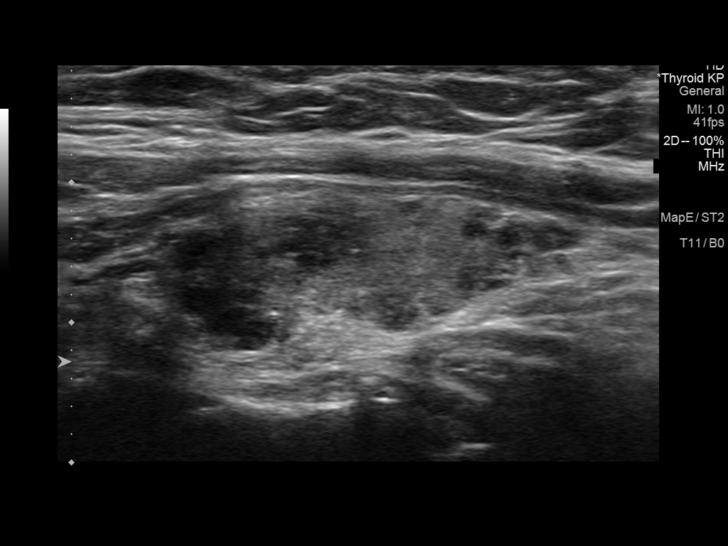
[im 41/45]
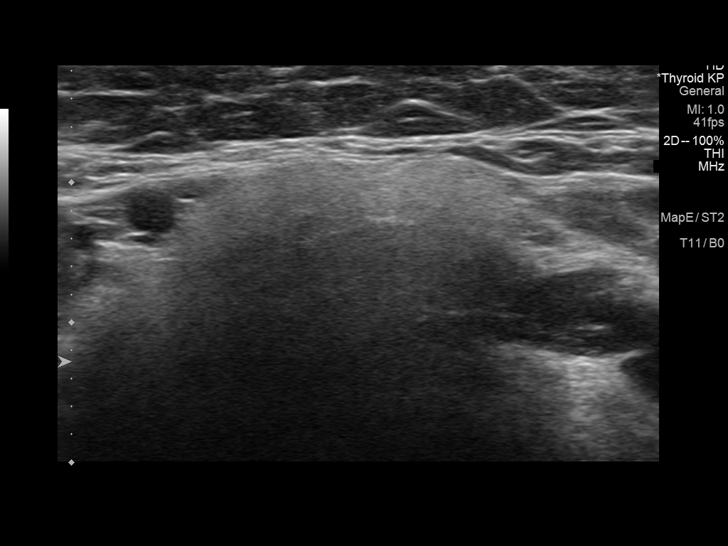
[im 45/45]
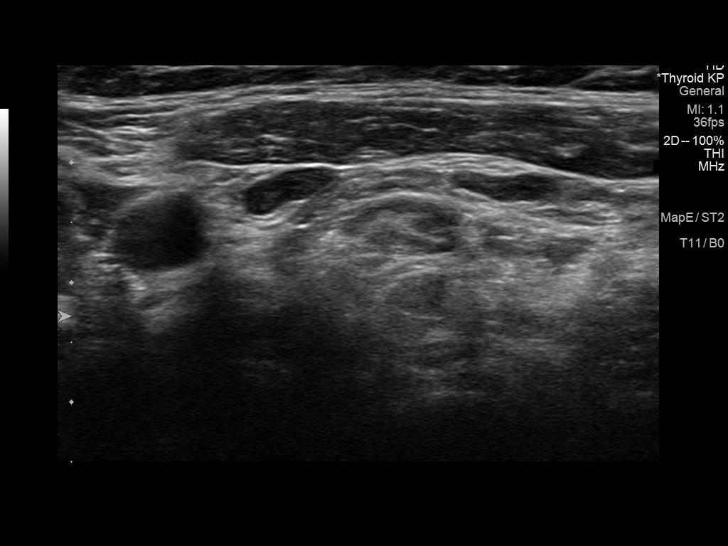

[13 of 25 positions shown; findings below may reference images not displayed]

FINDINGS: Parenchymal Echotexture: Moderately heterogenous

Isthmus: 0.2 cm

Right lobe: 4.0 cm x 1.6 cm x 1.8 cm

Left lobe: 3.4 cm x 1.0 cm x 1.1 cm

_________________________________________________________

Estimated total number of nodules >/= 1 cm: 0

Number of spongiform nodules >/=  2 cm not described below (TR1): 0

Number of mixed cystic and solid nodules >/= 1.5 cm not described
below (TR2): 0

_________________________________________________________

Heterogeneous appearance of thyroid tissue, with no discrete
right-sided nodule identified on today's study.

No adenopathy
IMPRESSION: Heterogeneous thyroid may indicate medical thyroid disease.

There is no discrete nodule measured within the right thyroid, with
an overall similar heterogeneous appearance of the right thyroid
tissue compared to the prior ultrasound survey. Given the appearance
on the prior ultrasound of 03/21/2018, either a follow-up 1 year
ultrasound survey or discontinuation of surveillance may be
reasonable.

Recommendations follow those established by the new ACR TI-RADS
criteria ([HOSPITAL] 4840;[DATE]).

## 2021-10-30 ENCOUNTER — Other Ambulatory Visit: Payer: Self-pay | Admitting: Family Medicine

## 2022-02-06 DIAGNOSIS — G911 Obstructive hydrocephalus: Secondary | ICD-10-CM | POA: Diagnosis not present

## 2022-02-06 DIAGNOSIS — Z6835 Body mass index (BMI) 35.0-35.9, adult: Secondary | ICD-10-CM | POA: Diagnosis not present

## 2022-02-12 ENCOUNTER — Other Ambulatory Visit: Payer: Self-pay | Admitting: Family Medicine

## 2022-02-13 ENCOUNTER — Telehealth: Payer: Self-pay

## 2022-02-13 NOTE — Telephone Encounter (Signed)
E-scribed refill 

## 2022-02-13 NOTE — Telephone Encounter (Addendum)
Spoke with pt's husband, Willette Alma (on dpr), asking him to have pt call our office.  Needs to schedule fasting lab visit this week for her CPE on 02/23/22.

## 2022-02-14 NOTE — Telephone Encounter (Signed)
Spoke with pt scheduling cpe lab visit.

## 2022-02-15 ENCOUNTER — Other Ambulatory Visit: Payer: Self-pay | Admitting: Family Medicine

## 2022-02-15 ENCOUNTER — Other Ambulatory Visit (INDEPENDENT_AMBULATORY_CARE_PROVIDER_SITE_OTHER): Payer: BC Managed Care – PPO

## 2022-02-15 DIAGNOSIS — E039 Hypothyroidism, unspecified: Secondary | ICD-10-CM | POA: Diagnosis not present

## 2022-02-15 DIAGNOSIS — E782 Mixed hyperlipidemia: Secondary | ICD-10-CM

## 2022-02-15 DIAGNOSIS — Z1159 Encounter for screening for other viral diseases: Secondary | ICD-10-CM | POA: Diagnosis not present

## 2022-02-15 LAB — LIPID PANEL
Cholesterol: 165 mg/dL (ref 0–200)
HDL: 46.6 mg/dL (ref >39.00)
NonHDL: 118.73
Total CHOL/HDL Ratio: 4
Triglycerides: 253 mg/dL — ABNORMAL HIGH (ref 0.0–149.0)
VLDL: 50.6 mg/dL — ABNORMAL HIGH (ref 0.0–40.0)

## 2022-02-15 LAB — COMPREHENSIVE METABOLIC PANEL
ALT: 33 U/L (ref 0–35)
AST: 21 U/L (ref 0–37)
Albumin: 4.4 g/dL (ref 3.5–5.2)
Alkaline Phosphatase: 69 U/L (ref 39–117)
BUN: 8 mg/dL (ref 6–23)
CO2: 24 mEq/L (ref 19–32)
Calcium: 9.7 mg/dL (ref 8.4–10.5)
Chloride: 107 mEq/L (ref 96–112)
Creatinine, Ser: 0.95 mg/dL (ref 0.40–1.20)
GFR: 69.16 mL/min (ref >60.00)
Glucose, Bld: 98 mg/dL (ref 70–99)
Potassium: 4.6 mEq/L (ref 3.5–5.1)
Sodium: 144 mEq/L (ref 135–145)
Total Bilirubin: 0.6 mg/dL (ref 0.2–1.2)
Total Protein: 7.4 g/dL (ref 6.0–8.3)

## 2022-02-15 LAB — LDL CHOLESTEROL, DIRECT: Direct LDL: 88 mg/dL

## 2022-02-15 LAB — TSH: TSH: 0.58 u[IU]/mL (ref 0.35–5.50)

## 2022-02-16 LAB — HEPATITIS C ANTIBODY: Hepatitis C Ab: NONREACTIVE

## 2022-02-23 ENCOUNTER — Encounter: Payer: Self-pay | Admitting: Family Medicine

## 2022-02-23 ENCOUNTER — Ambulatory Visit (INDEPENDENT_AMBULATORY_CARE_PROVIDER_SITE_OTHER): Payer: BC Managed Care – PPO | Admitting: Family Medicine

## 2022-02-23 VITALS — BP 124/86 | HR 71 | Temp 97.9°F | Ht 67.0 in | Wt 229.5 lb

## 2022-02-23 DIAGNOSIS — Z23 Encounter for immunization: Secondary | ICD-10-CM | POA: Diagnosis not present

## 2022-02-23 DIAGNOSIS — Z1211 Encounter for screening for malignant neoplasm of colon: Secondary | ICD-10-CM | POA: Diagnosis not present

## 2022-02-23 DIAGNOSIS — E039 Hypothyroidism, unspecified: Secondary | ICD-10-CM | POA: Diagnosis not present

## 2022-02-23 DIAGNOSIS — Z0000 Encounter for general adult medical examination without abnormal findings: Secondary | ICD-10-CM

## 2022-02-23 DIAGNOSIS — G919 Hydrocephalus, unspecified: Secondary | ICD-10-CM

## 2022-02-23 DIAGNOSIS — E782 Mixed hyperlipidemia: Secondary | ICD-10-CM

## 2022-02-23 DIAGNOSIS — K219 Gastro-esophageal reflux disease without esophagitis: Secondary | ICD-10-CM

## 2022-02-23 DIAGNOSIS — E041 Nontoxic single thyroid nodule: Secondary | ICD-10-CM

## 2022-02-23 DIAGNOSIS — K146 Glossodynia: Secondary | ICD-10-CM

## 2022-02-23 MED ORDER — ATORVASTATIN CALCIUM 20 MG PO TABS: 20.0000 mg | ORAL_TABLET | Freq: Every day | ORAL | 3 refills | Status: DC

## 2022-02-23 MED ORDER — LEVOTHYROXINE SODIUM 150 MCG PO TABS: 150.0000 ug | ORAL_TABLET | Freq: Every day | ORAL | 3 refills | Status: DC

## 2022-02-23 NOTE — Assessment & Plan Note (Addendum)
Latest thyroid US without nodule (2020). Normal exam without obvious thyroid nodule - will not pursue further thyroid imaging.

## 2022-02-23 NOTE — Assessment & Plan Note (Addendum)
Chronic, stable on current regimen.  

## 2022-02-23 NOTE — Assessment & Plan Note (Signed)
Preventative protocols reviewed and updated unless pt declined. Discussed healthy diet and lifestyle.  

## 2022-02-23 NOTE — Patient Instructions (Addendum)
Flu shot today  Pass by lab to pick up stool kit.  We will request latest mammogram and pap smear to [facility]).  Consider shingles vaccine.  Decrease added sugars, eliminate trans fats, increase fiber and limit alcohol. Increase fatty fish (salmon, tuna, trout, etc) in the diet - these fish are rich in omega three fatty acids. All these changes together can drop triglycerides by almost 50%.  Work on incorporating regular exercise into the routine. Goal 150 min/week.  Return as needed or in 1 year for next physical.   Health Maintenance, Female Adopting a healthy lifestyle and getting preventive care are important in promoting health and wellness. Ask your health care provider about: The right schedule for you to have regular tests and exams. Things you can do on your own to prevent diseases and keep yourself healthy. What should I know about diet, weight, and exercise? Eat a healthy diet  Eat a diet that includes plenty of vegetables, fruits, low-fat dairy products, and lean protein. Do not eat a lot of foods that are high in solid fats, added sugars, or sodium. Maintain a healthy weight Body mass index (BMI) is used to identify weight problems. It estimates body fat based on height and weight. Your health care provider can help determine your BMI and help you achieve or maintain a healthy weight. Get regular exercise Get regular exercise. This is one of the most important things you can do for your health. Most adults should: Exercise for at least 150 minutes each week. The exercise should increase your heart rate and make you sweat (moderate-intensity exercise). Do strengthening exercises at least twice a week. This is in addition to the moderate-intensity exercise. Spend less time sitting. Even light physical activity can be beneficial. Watch cholesterol and blood lipids Have your blood tested for lipids and cholesterol at 52 years of age, then have this test every 5  years. Have your cholesterol levels checked more often if: Your lipid or cholesterol levels are high. You are older than 52 years of age. You are at high risk for heart disease. What should I know about cancer screening? Depending on your health history and family history, you may need to have cancer screening at various ages. This may include screening for: Breast cancer. Cervical cancer. Colorectal cancer. Skin cancer. Lung cancer. What should I know about heart disease, diabetes, and high blood pressure? Blood pressure and heart disease High blood pressure causes heart disease and increases the risk of stroke. This is more likely to develop in people who have high blood pressure readings or are overweight. Have your blood pressure checked: Every 3-5 years if you are 24-30 years of age. Every year if you are 29 years old or older. Diabetes Have regular diabetes screenings. This checks your fasting blood sugar level. Have the screening done: Once every three years after age 51 if you are at a normal weight and have a low risk for diabetes. More often and at a younger age if you are overweight or have a high risk for diabetes. What should I know about preventing infection? Hepatitis B If you have a higher risk for hepatitis B, you should be screened for this virus. Talk with your health care provider to find out if you are at risk for hepatitis B infection. Hepatitis C Testing is recommended for: Everyone born from [birth year] through 1966. Anyone with known risk factors for hepatitis C. Sexually transmitted infections (STIs) Get screened for STIs, including gonorrhea and  chlamydia, if: You are sexually active and are younger than 52 years of age. You are older than 52 years of age and your health care provider tells you that you are at risk for this type of infection. Your sexual activity has changed since you were last screened, and you are at increased risk for chlamydia or gonorrhea.  Ask your health care provider if you are at risk. Ask your health care provider about whether you are at high risk for HIV. Your health care provider may recommend a prescription medicine to help prevent HIV infection. If you choose to take medicine to prevent HIV, you should first get tested for HIV. You should then be tested every 3 months for as long as you are taking the medicine. Pregnancy If you are about to stop having your period (premenopausal) and you may become pregnant, seek counseling before you get pregnant. Take 400 to 800 micrograms (mcg) of folic acid every day if you become pregnant. Ask for birth control (contraception) if you want to prevent pregnancy. Osteoporosis and menopause Osteoporosis is a disease in which the bones lose minerals and strength with aging. This can result in bone fractures. If you are 53 years old or older, or if you are at risk for osteoporosis and fractures, ask your health care provider if you should: Be screened for bone loss. Take a calcium or vitamin D supplement to lower your risk of fractures. Be given hormone replacement therapy (HRT) to treat symptoms of menopause. Follow these instructions at home: Alcohol use Do not drink alcohol if: Your health care provider tells you not to drink. You are pregnant, may be pregnant, or are planning to become pregnant. If you drink alcohol: Limit how much you have to: 0-1 drink a day. Know how much alcohol is in your drink. In the [place] one drink equals one 12 oz bottle of beer (355 mL), one 5 oz glass of wine (148 mL), or one 1 oz glass of hard liquor (44 mL). Lifestyle Do not use any products that contain nicotine or tobacco. These products include cigarettes, chewing tobacco, and vaping devices, such as e-cigarettes. If you need help quitting, ask your health care provider. Do not use street drugs. Do not share needles. Ask your health care provider for help if you need support or information about  quitting drugs. General instructions Schedule regular health, dental, and eye exams. Stay current with your vaccines. Tell your health care provider if: You often feel depressed. You have ever been abused or do not feel safe at home. Summary Adopting a healthy lifestyle and getting preventive care are important in promoting health and wellness. Follow your health care provider's instructions about healthy diet, exercising, and getting tested or screened for diseases. Follow your health care provider's instructions on monitoring your cholesterol and blood pressure. This information is not intended to replace advice given to you by your health care provider. Make sure you discuss any questions you have with your health care provider. Document Revised: 10/24/2020 Document Reviewed: 10/24/2020 Elsevier Patient Education  [place].

## 2022-02-23 NOTE — Assessment & Plan Note (Signed)
No reflux symptoms.

## 2022-02-23 NOTE — Assessment & Plan Note (Signed)
VP shunt in place. Saw [facility], told no f/u needed.

## 2022-02-23 NOTE — Assessment & Plan Note (Signed)
Followed by ENT, on PPI and gabapentin

## 2022-02-23 NOTE — Assessment & Plan Note (Addendum)
Chronic, improving on atorvastatin 20mg  daily - continue. Triglycerides remain elevated but are improving. Reviewed diet choices to improve trig control.  The 10-year ASCVD risk score (Arnett DK, et al., 2019) is: 1.2%   Values used to calculate the score:     Age: 51 years     Sex: Female     Is Non-Hispanic African American: No     Diabetic: No     Tobacco smoker: No     Systolic Blood Pressure: 124 mmHg     Is BP treated: No     HDL Cholesterol: 46.6 mg/dL     Total Cholesterol: 165 mg/dL

## 2022-02-23 NOTE — Assessment & Plan Note (Signed)
Encouraged healthy diet and lifestyle choices to affect sustainable weight loss.  ?

## 2022-02-23 NOTE — Progress Notes (Signed)
Patient ID: Ashley Boone, female    DOB: 10-08-1970, 51 y.o.   MRN: [id number]  This visit was conducted in person.  BP 124/86   Pulse 71   Temp 97.9 F (36.6 C) (Temporal)   Ht 5' 7"  (1.702 m)   Wt 229 lb 8 oz (104.1 kg)   SpO2 96%   BMI 35.94 kg/m    CC: CPE Subjective:   HPI: Ashley Boone is a 52 y.o. female presenting on 02/23/2022 for Annual Exam   Mother tripped and broke neck - she and sister are caring for her.   Burning tongue on gabapentin 345m tid and omeprazole 460mbid, [facility]   Saw neurosurgeon - Cabbell - told no follow up unless issues develop.   Preventative: Colon cancer screening - iFOB negative 08/2020 - requests rpt Mammo through OBGYN - will request records  Well woman exam with [facility] Dr HeUlanda Edisonretired) with normal paps - will request records  Lung cancer screening - not eligible  Flu shot - yearly COVID vaccine - discussed, declines  Tetanus shot - unsure. Tdap ?2019 - not documented  Shingrix - discussed  Seat belt use discussed  Sunscreen use discussed. No changing moles on skin  Non smoker  Alcohol - none  Dentist q6 mo  Eye exam - yearly   Lives husband and son (52yo daughter (52yo daughter (8y88yo and FIL ([id number]: self employed - [employer]  Activity: walks dog  Diet: good water, fruits/vegetables daily, 1 cup decaf coffee, water, sweet tea and 1 dr pepper per day     Relevant past medical, surgical, family and social history reviewed and updated as indicated. Interim medical history since our last visit reviewed. Allergies and medications reviewed and updated. Outpatient Medications Prior to Visit  Medication Sig Dispense Refill   fluticasone (FLONASE) 50 MCG/ACT nasal spray Place 2 sprays into both nostrils daily as needed for allergies. 16 g 6   gabapentin (NEURONTIN) 300 MG capsule Take 300 mg by mouth 3 (three) times daily.  5   Multiple Vitamin  (MULTIVITAMIN) tablet Take 1 tablet by mouth daily.     omeprazole (PRILOSEC) 40 MG capsule Take 40 mg by mouth 2 (two) times daily.     atorvastatin (LIPITOR) 20 MG tablet TAKE 1 TABLET (20 MG TOTAL) BY MOUTH DAILY. 90 tablet 3   levothyroxine (SYNTHROID) 150 MCG tablet Take 1 tablet (150 mcg total) by mouth daily before breakfast. 90 tablet 0   No facility-administered medications prior to visit.     Per HPI unless specifically indicated in ROS section below Review of Systems  Constitutional:  Negative for activity change, appetite change, chills, fatigue, fever and unexpected weight change.  HENT:  Negative for hearing loss.   Eyes:  Negative for visual disturbance.  Respiratory:  Negative for cough, chest tightness, shortness of breath and wheezing.   Cardiovascular:  Negative for chest pain, palpitations and leg swelling.  Gastrointestinal:  Negative for abdominal distention, abdominal pain, blood in stool, constipation, diarrhea, nausea and vomiting.  Genitourinary:  Negative for difficulty urinating and hematuria.  Musculoskeletal:  Negative for arthralgias, myalgias and neck pain.  Skin:  Negative for rash.  Neurological:  Negative for dizziness, seizures, syncope and headaches.  Hematological:  Negative for adenopathy. Does not bruise/bleed easily.  Psychiatric/Behavioral:  Negative for dysphoric mood. The patient is not nervous/anxious.     Objective:  BP 124/86   Pulse  71   Temp 97.9 F (36.6 C) (Temporal)   Ht 5' 7"  (1.702 m)   Wt 229 lb 8 oz (104.1 kg)   SpO2 96%   BMI 35.94 kg/m   Wt Readings from Last 3 Encounters:  02/23/22 229 lb 8 oz (104.1 kg)  08/02/20 231 lb 2 oz (104.8 kg)  05/02/18 228 lb (103.4 kg)      Physical Exam Vitals and nursing note reviewed.  Constitutional:      Appearance: Normal appearance. She is not ill-appearing.  HENT:     Head: Normocephalic and atraumatic.     Right Ear: Tympanic membrane, ear canal and external ear normal. There  is no impacted cerumen.     Left Ear: Tympanic membrane, ear canal and external ear normal. There is no impacted cerumen.  Eyes:     General:        Right eye: No discharge.        Left eye: No discharge.     Extraocular Movements: Extraocular movements intact.     Conjunctiva/sclera: Conjunctivae normal.     Pupils: Pupils are equal, round, and reactive to light.  Neck:     Thyroid: No thyroid mass or thyromegaly.  Cardiovascular:     Rate and Rhythm: Normal rate and regular rhythm.     Pulses: Normal pulses.     Heart sounds: Normal heart sounds. No murmur heard. Pulmonary:     Effort: Pulmonary effort is normal. No respiratory distress.     Breath sounds: Normal breath sounds. No wheezing, rhonchi or rales.  Abdominal:     General: Bowel sounds are normal. There is no distension.     Palpations: Abdomen is soft. There is no mass.     Tenderness: There is no abdominal tenderness. There is no guarding or rebound.     Hernia: No hernia is present.  Musculoskeletal:     Cervical back: Normal range of motion and neck supple. No rigidity.     Right lower leg: No edema.     Left lower leg: No edema.  Lymphadenopathy:     Cervical: No cervical adenopathy.  Skin:    General: Skin is warm and dry.     Findings: No rash.  Neurological:     General: No focal deficit present.     Mental Status: She is alert. Mental status is at baseline.  Psychiatric:        Mood and Affect: Mood normal.        Behavior: Behavior normal.       Results for orders placed or performed in visit on 02/15/22  Hepatitis C antibody  Result Value Ref Range   Hepatitis C Ab NON-REACTIVE NON-REACTIVE  TSH  Result Value Ref Range   TSH 0.58 0.35 - 5.50 uIU/mL  Lipid panel  Result Value Ref Range   Cholesterol 165 0 - 200 mg/dL   Triglycerides 253.0 (H) 0.0 - 149.0 mg/dL   HDL 46.60 >39.00 mg/dL   VLDL 50.6 (H) 0.0 - 40.0 mg/dL   Total CHOL/HDL Ratio 4    NonHDL 118.73   Comprehensive metabolic panel   Result Value Ref Range   Sodium 144 135 - 145 mEq/L   Potassium 4.6 3.5 - 5.1 mEq/L   Chloride 107 96 - 112 mEq/L   CO2 24 19 - 32 mEq/L   Glucose, Bld 98 70 - 99 mg/dL   BUN 8 6 - 23 mg/dL   Creatinine, Ser 0.95 0.40 - 1.20 mg/dL  Total Bilirubin 0.6 0.2 - 1.2 mg/dL   Alkaline Phosphatase 69 39 - 117 U/L   AST 21 0 - 37 U/L   ALT 33 0 - 35 U/L   Total Protein 7.4 6.0 - 8.3 g/dL   Albumin 4.4 3.5 - 5.2 g/dL   GFR 69.16 >60.00 mL/min   Calcium 9.7 8.4 - 10.5 mg/dL  LDL cholesterol, direct  Result Value Ref Range   Direct LDL 88.0 mg/dL    Assessment & Plan:   Problem List Items Addressed This Visit     Health maintenance examination - Primary (Chronic)    Preventative protocols reviewed and updated unless pt declined. Discussed healthy diet and lifestyle.       Hypothyroidism    Chronic, stable on current regimen.       Relevant Medications   levothyroxine (SYNTHROID) 150 MCG tablet   Hydrocephalus (HCC)    VP shunt in place. Saw Cabbell, told no f/u needed.       GERD (gastroesophageal reflux disease)    No reflux symptoms.       Severe obesity (BMI 35.0-39.9) with comorbidity ([place])    Encouraged healthy diet and lifestyle choices to affect sustainable weight loss.       Mixed hyperlipidemia    Chronic, improving on atorvastatin 67m daily - continue. Triglycerides remain elevated but are improving. Reviewed diet choices to improve trig control.  The 10-year ASCVD risk score (Arnett DK, et al., 2019) is: 1.2%   Values used to calculate the score:     Age: [age]years     Sex: Female     Is Non-Hispanic African American: No     Diabetic: No     Tobacco smoker: No     Systolic Blood Pressure: 1355mmHg     Is BP treated: No     HDL Cholesterol: 46.6 mg/dL     Total Cholesterol: 165 mg/dL       Relevant Medications   atorvastatin (LIPITOR) 20 MG tablet   Right thyroid nodule    Latest thyroid [place] nod[place] (2020). Normal exam without obvious thyroid  nodule - will not pursue further thyroid imaging.       Relevant Medications   levothyroxine (SYNTHROID) 150 MCG tablet   Burning tongue syndrome    Followed by ENT, on PPI and gabapentin      Other Visit Diagnoses     Need for influenza vaccination       Relevant Orders   Flu Vaccine QUAD 6442moM (Fluarix, Fluzone & Alfiuria Quad PF) (Completed)   Special screening for malignant neoplasms, colon       Relevant Orders   Fecal occult blood, imunochemical        Meds ordered this encounter  Medications   atorvastatin (LIPITOR) 20 MG tablet    Sig: Take 1 tablet (20 mg total) by mouth daily.    Dispense:  90 tablet    Refill:  3   levothyroxine (SYNTHROID) 150 MCG tablet    Sig: Take 1 tablet (150 mcg total) by mouth daily before breakfast.    Dispense:  90 tablet    Refill:  3   Orders Placed This Encounter  Procedures   Fecal occult blood, imunochemical    Standing Status:   Future    Standing Expiration Date:   02/24/2023   Flu Vaccine QUAD 42m35mo (Fluarix, Fluzone & Alfiuria Quad PF)    Patient instructions: Flu shot today  Pass by lab to pick up  stool kit.  We will request latest mammogram and pap smear to [facility]  Consider shingles vaccine.  Decrease added sugars, eliminate trans fats, increase fiber and limit alcohol. Increase fatty fish (salmon, tuna, trout, etc) in the diet - these fish are rich in omega three fatty acids. All these changes together can drop triglycerides by almost 50%.  Work on incorporating regular exercise into the routine. Goal 150 min/week.  Return as needed or in 1 year for next physical.   Follow up plan: Return in about 1 year (around 02/24/2023) for annual exam, prior fasting for blood work.  Ria Bush, MD

## 2022-03-02 ENCOUNTER — Telehealth: Payer: Self-pay

## 2022-03-02 ENCOUNTER — Ambulatory Visit: Payer: BC Managed Care – PPO | Admitting: Nurse Practitioner

## 2022-03-02 ENCOUNTER — Ambulatory Visit
Admission: EM | Admit: 2022-03-02 | Discharge: 2022-03-02 | Disposition: A | Discharge status: Home / Self Care | Payer: BC Managed Care – PPO | Attending: Physician Assistant | Admitting: Physician Assistant

## 2022-03-02 DIAGNOSIS — H1033 Unspecified acute conjunctivitis, bilateral: Secondary | ICD-10-CM | POA: Diagnosis not present

## 2022-03-02 MED ORDER — POLYMYXIN B-TRIMETHOPRIM 10000-0.1 UNIT/ML-% OP SOLN: 1.0000 [drp] | OPHTHALMIC | 0 refills | Status: AC

## 2022-03-02 NOTE — ED Provider Notes (Signed)
Ashley Boone    CSN: [id number] Arrival date & time: 03/02/22  0910      History   Chief Complaint Chief Complaint  Patient presents with   Conjunctivitis    HPI STORY CONTI is a 51 y.o. female.   Patient here today for evaluation of bilateral eye redness and irritation that started last night.  She reports she has had some crusting and drainage since symptom onset.  She has not had any fever.  She has not used any medication for symptoms.  She does not report any injury to her eyes.  The history is provided by the patient.  Conjunctivitis    Past Medical History:  Diagnosis Date   Depression    GERD (gastroesophageal reflux disease)    Hydrocephalus (HCC)    shunt (Cabbell)   Hypothyroidism    Obesity     Patient Active Problem List   Diagnosis Date Noted   Health maintenance examination 05/01/2018   Right thyroid nodule 03/17/2018   Burning tongue syndrome 03/17/2018   Mixed hyperlipidemia 12/22/2016   Hypothyroidism    Hydrocephalus (HCC)    GERD (gastroesophageal reflux disease)    Severe obesity (BMI 35.0-39.9) with comorbidity [facility])     Past Surgical History:  Procedure Laterality Date   ANKLE SURGERY Left 1996   with plates and screws   VENTRICULOPERITONEAL SHUNT  [phone]   with revisions, Cabbell    OB History   No obstetric history on file.      Home Medications    Prior to Admission medications   Medication Sig Start Date End Date Taking? Authorizing Provider  trimethoprim-polymyxin b (POLYTRIM) ophthalmic solution Place 1 drop into both eyes every 4 (four) hours for 7 days. 03/02/22 03/09/22 Yes Tomi Bamberger, PA-C  atorvastatin (LIPITOR) 20 MG tablet Take 1 tablet (20 mg total) by mouth daily. 02/23/22   Eustaquio Boyden, MD  fluticasone [facility]) 50 MCG/ACT nasal spray Place 2 sprays into both nostrils daily as needed for allergies. 04/28/21   Eustaquio Boyden, MD  gabapentin (NEURONTIN) 300 MG capsule Take  300 mg by mouth 3 (three) times daily. 02/12/18   [provider]  levothyroxine (SYNTHROID) 150 MCG tablet Take 1 tablet (150 mcg total) by mouth daily before breakfast. 02/23/22   Eustaquio Boyden, MD  Multiple Vitamin (MULTIVITAMIN) tablet Take 1 tablet by mouth daily.    [provider]  omeprazole (PRILOSEC) 40 MG capsule Take 40 mg by mouth 2 (two) times daily.    [provider]  ferrous sulfate 324 (65 Fe) MG TBEC Take 1 tablet (325 mg total) by mouth daily. 05/02/18 03/08/20  Eustaquio Boyden, MD    Family History Family History  Problem Relation Age of Onset   CAD Father 62       MI   Hypertension Father    Diabetes Father    Cancer Neg Hx    Stroke Neg Hx     Social History Social History   Tobacco Use   Smoking status: Never   Smokeless tobacco: Never  Substance Use Topics   Alcohol use: No   Drug use: No     Allergies   Patient has no known allergies.   Review of Systems Review of Systems  Constitutional:  Negative for chills and fever.  Eyes:  Positive for discharge and redness.  Gastrointestinal:  Negative for nausea and vomiting.     Physical Exam Triage Vital Signs ED Triage Vitals  Enc Vitals Group  BP 03/02/22 0922 136/84     Pulse Rate 03/02/22 0922 74     Resp 03/02/22 0922 20     Temp 03/02/22 0922 97.6 F (36.4 C)     Temp src --      SpO2 03/02/22 0922 96 %     Weight --      Height --      Head Circumference --      Peak Flow --      Pain Score 03/02/22 0926 4     Pain Loc --      Pain Edu? --      Excl. in GC? --    No data found.  Updated Vital Signs BP 136/84   Pulse 74   Temp 97.6 F (36.4 C)   Resp 20   SpO2 96%      Physical Exam Vitals and nursing note reviewed.  Constitutional:      General: She is not in acute distress.    Appearance: Normal appearance. She is not ill-appearing.  HENT:     Head: Normocephalic and atraumatic.  Eyes:     Extraocular Movements: Extraocular  movements intact.     Pupils: Pupils are equal, round, and reactive to light.     Comments: Bilateral conjunctiva injected  Cardiovascular:     Rate and Rhythm: Normal rate.  Pulmonary:     Effort: Pulmonary effort is normal.  Neurological:     Mental Status: She is alert.  Psychiatric:        Mood and Affect: Mood normal.        Behavior: Behavior normal.        Thought Content: Thought content normal.      UC Treatments / Results  Labs (all labs ordered are listed, but only abnormal results are displayed) Labs Reviewed - No data to display  EKG   Radiology No results found.  Procedures Procedures (including critical Boone time)  Medications Ordered in UC Medications - No data to display  Initial Impression / Assessment and Plan / UC Course  I have reviewed the triage vital signs and the nursing notes.  Pertinent labs & imaging results that were available during my Boone of the patient were reviewed by me and considered in my medical decision making (see chart for details).    Polytrim prescribed for conjunctivitis.  Recommended follow-up if no gradual improvement or with any further concerns.  Final Clinical Impressions(s) / UC Diagnoses   Final diagnoses:  Acute conjunctivitis of both eyes, unspecified acute conjunctivitis type   Discharge Instructions   None    ED Prescriptions     Medication Sig Dispense Auth. Provider   trimethoprim-polymyxin b (POLYTRIM) ophthalmic solution Place 1 drop into both eyes every 4 (four) hours for 7 days. 10 mL Tomi Bamberger, PA-C      PDMP not reviewed this encounter.   Tomi Bamberger, PA-C 03/02/22 1035

## 2022-03-02 NOTE — Telephone Encounter (Signed)
[facility] Night - Client Nonclinical Telephone Record  [facility] Client [facility] Night - Client Client Site [facility] [place] - Night Contact Type Call Who Is Calling Patient / Member / Family / Caregiver Caller Name Ashley Boone Phone Number [phone] Patient Name Ashley Boone Patient DOB 1970-09-19 Call Type Message Only Information Provided Reason for Call Request to Schedule Office Appointment Initial Comment Caller states that she has pink eye and she needs to schedule an appointment for today. Declined triage. Patient request to speak to RN No Disp. Time Disposition Final User 03/02/2022 7:12:51 AM General Information Provided Yes Brooke Pace Call Closed By: Brooke Pace Transaction Date/Time: 03/02/2022 7:10:20 AM (ET  Per appt notes pt already has appt with Audria Nine NP today at 3:00 pm. Sending note to Audria Nine NP.

## 2022-03-02 NOTE — Telephone Encounter (Signed)
noted 

## 2022-03-02 NOTE — ED Triage Notes (Signed)
Pt presents to uc with co of bilateral eye pain, redness, irritation, drainage and blurred vision since last night . Pt reports she has not used any otc medications.

## 2022-03-23 DIAGNOSIS — M79641 Pain in right hand: Secondary | ICD-10-CM | POA: Diagnosis not present

## 2022-03-23 DIAGNOSIS — M25531 Pain in right wrist: Secondary | ICD-10-CM | POA: Diagnosis not present

## 2022-08-08 DIAGNOSIS — H01001 Unspecified blepharitis right upper eyelid: Secondary | ICD-10-CM | POA: Diagnosis not present

## 2022-08-08 DIAGNOSIS — H1031 Unspecified acute conjunctivitis, right eye: Secondary | ICD-10-CM | POA: Diagnosis not present

## 2022-09-11 ENCOUNTER — Encounter: Payer: Self-pay | Admitting: Urology

## 2022-09-11 DIAGNOSIS — D4101 Neoplasm of uncertain behavior of right kidney: Secondary | ICD-10-CM

## 2022-09-11 DIAGNOSIS — R310 Gross hematuria: Secondary | ICD-10-CM

## 2022-09-18 ENCOUNTER — Ambulatory Visit (HOSPITAL_COMMUNITY): Payer: Self-pay

## 2022-10-26 ENCOUNTER — Ambulatory Visit
Admission: EM | Admit: 2022-10-26 | Discharge: 2022-10-26 | Disposition: A | Discharge status: Home / Self Care | Payer: BC Managed Care – PPO | Attending: Internal Medicine | Admitting: Internal Medicine

## 2022-10-26 DIAGNOSIS — Z1152 Encounter for screening for COVID-19: Secondary | ICD-10-CM | POA: Insufficient documentation

## 2022-10-26 DIAGNOSIS — R519 Headache, unspecified: Secondary | ICD-10-CM | POA: Diagnosis not present

## 2022-10-26 DIAGNOSIS — J069 Acute upper respiratory infection, unspecified: Secondary | ICD-10-CM | POA: Diagnosis not present

## 2022-10-26 MED ORDER — GUAIFENESIN ER 600 MG PO TB12: 1200.0000 mg | ORAL_TABLET | Freq: Two times a day (BID) | ORAL | 0 refills | Status: DC

## 2022-10-26 MED ORDER — BENZONATATE 100 MG PO CAPS: 100.0000 mg | ORAL_CAPSULE | Freq: Three times a day (TID) | ORAL | 0 refills | Status: DC

## 2022-10-26 MED ORDER — ACETAMINOPHEN 325 MG PO TABS
975.0000 mg | ORAL_TABLET | Freq: Once | ORAL | Status: AC
  Administered 2022-10-26: 975 mg via ORAL

## 2022-10-26 NOTE — Discharge Instructions (Addendum)
You have a viral upper respiratory infection.   Use the following medicines to help with symptoms: - Plain Mucinex (guaifenesin) over the counter as directed every 12 hours to thin mucous so that you are able to get it out of your body easier. Drink plenty of water while taking this medication so that it works well in your body (at least 8 cups a day).  - Tylenol 1,000mg  every 6 hours with food as needed for aches/pains or fever/chills.  - Tessalon perles every 8 hours as needed for cough.  1 tablespoon of honey in warm water and/or salt water gargles may also help with symptoms. Humidifier to your room will help add water to the air and reduce coughing.  If you develop any new or worsening symptoms, please return.  If your symptoms are severe, please go to the emergency room.  Follow-up with your primary care provider for further evaluation and management of your symptoms as well as ongoing wellness visits.  I hope you feel better!

## 2022-10-26 NOTE — ED Triage Notes (Signed)
Pt reports sinus pressure and cough x 2 days. Head hurts when cough. Sudafed gives no relief.

## 2022-10-26 NOTE — ED Provider Notes (Signed)
[name]    CSN: [id number] Arrival date & time: 10/26/22  0809      History   Chief Complaint Chief Complaint  Patient presents with   Cough    HPI Ashley Boone is a 52 y.o. female.   Patient with history of hydrocephalus, GERD, and hypothyroidism presents to urgent care for evaluation of cough, congestion, and headache that started 2 days ago on Wednesday Oct 24, 2022. No known sick contacts with similar symptoms. Cough is intermittently productive with clear/yellow phlegm.  Head hurts when she coughs.  Denies vision changes, nausea, vomiting, abdominal pain, rash, sore throat, ear pain, ear drainage, and recent trauma/injury to the head.  Denies dizziness and one-sided weakness.  Denies history of chronic respiratory problems.  Never smoker, denies drug use.  Denies recent COVID-19 infection and recent antibiotic/steroid use.  Using over-the-counter medications without much relief.   Cough   Past Medical History:  Diagnosis Date   Depression    GERD (gastroesophageal reflux disease)    Hydrocephalus (HCC)    shunt ([place])   Hypothyroidism    Obesity     Patient Active Problem List   Diagnosis Date Noted   Health maintenance examination 05/01/2018   Right thyroid nodule 03/17/2018   Burning tongue syndrome 03/17/2018   Mixed hyperlipidemia 12/22/2016   Hypothyroidism    Hydrocephalus (HCC)    GERD (gastroesophageal reflux disease)    Severe obesity (BMI 35.0-39.9) with comorbidity [facility])     Past Surgical History:  Procedure Laterality Date   ANKLE SURGERY Left 1996   with plates and screws   VENTRICULOPERITONEAL SHUNT  [id number]   with revisions, [place]    OB History   No obstetric history on file.      Home Medications    Prior to Admission medications   Medication Sig Start Date End Date Taking? Authorizing Provider  benzonatate (TESSALON) 100 MG capsule Take 1 capsule (100 mg total) by mouth every 8 (eight) hours.  10/26/22  Yes Carlisle Beers, FNP  guaiFENesin (MUCINEX) 600 MG 12 hr tablet Take 2 tablets (1,200 mg total) by mouth 2 (two) times daily. 10/26/22  Yes Carlisle Beers, FNP  pseudoephedrine (SUDAFED) 120 MG 12 hr tablet Take 120 mg by mouth 2 (two) times daily.   Yes [provider]  atorvastatin (LIPITOR) 20 MG tablet Take 1 tablet (20 mg total) by mouth daily. 02/23/22   Eustaquio Boyden, MD  fluticasone [facility]) 50 MCG/ACT nasal spray Place 2 sprays into both nostrils daily as needed for allergies. 04/28/21   Eustaquio Boyden, MD  gabapentin (NEURONTIN) 300 MG capsule Take 300 mg by mouth 3 (three) times daily. 02/12/18   [provider]  levothyroxine (SYNTHROID) 150 MCG tablet Take 1 tablet (150 mcg total) by mouth daily before breakfast. 02/23/22   Eustaquio Boyden, MD  Multiple Vitamin (MULTIVITAMIN) tablet Take 1 tablet by mouth daily.    [provider]  omeprazole (PRILOSEC) 40 MG capsule Take 40 mg by mouth 2 (two) times daily.    [provider]  ferrous sulfate 324 (65 Fe) MG TBEC Take 1 tablet (325 mg total) by mouth daily. 05/02/18 03/08/20  Eustaquio Boyden, MD    Family History Family History  Problem Relation Age of Onset   CAD Father 58       MI   Hypertension Father    Diabetes Father    Cancer Neg Hx    Stroke Neg Hx     Social  History Social History   Tobacco Use   Smoking status: Never   Smokeless tobacco: Never  Substance Use Topics   Alcohol use: No   Drug use: No     Allergies   Patient has no known allergies.   Review of Systems Review of Systems  Respiratory:  Positive for cough.   Per HPI   Physical Exam Triage Vital Signs ED Triage Vitals  Enc Vitals Group     BP 10/26/22 0843 (!) 152/93     Pulse Rate 10/26/22 0843 78     Resp 10/26/22 0843 18     Temp 10/26/22 0843 98 F (36.7 C)     Temp Source 10/26/22 0843 Oral     SpO2 10/26/22 0843 96 %     Weight --      Height --      Head  Circumference --      Peak Flow --      Pain Score 10/26/22 0846 0     Pain Loc --      Pain Edu? --      Excl. in GC? --    No data found.  Updated Vital Signs BP (!) 152/93 (BP Location: Right Arm)   Pulse 78   Temp 98 F (36.7 C) (Oral)   Resp 18   SpO2 96%   Visual Acuity Right Eye Distance:   Left Eye Distance:   Bilateral Distance:    Right Eye Near:   Left Eye Near:    Bilateral Near:     Physical Exam Vitals and nursing note reviewed.  Constitutional:      Appearance: She is not ill-appearing or toxic-appearing.  HENT:     Head: Normocephalic and atraumatic.     Right Ear: Hearing, tympanic membrane, ear canal and external ear normal.     Left Ear: Hearing, tympanic membrane, ear canal and external ear normal.     Nose: Congestion present.     Mouth/Throat:     Lips: Pink.     Mouth: Mucous membranes are moist. No injury.     Tongue: No lesions. Tongue does not deviate from midline.     Palate: No mass and lesions.     Pharynx: Oropharynx is clear. Uvula midline. Posterior oropharyngeal erythema present. No pharyngeal swelling, oropharyngeal exudate or uvula swelling.     Tonsils: No tonsillar exudate or tonsillar abscesses.  Eyes:     General: Lids are normal. Vision grossly intact. Gaze aligned appropriately.     Extraocular Movements: Extraocular movements intact.     Conjunctiva/sclera: Conjunctivae normal.  Cardiovascular:     Rate and Rhythm: Normal rate and regular rhythm.     Heart sounds: Normal heart sounds, S1 normal and S2 normal.  Pulmonary:     Effort: Pulmonary effort is normal. No respiratory distress.     Breath sounds: Normal breath sounds and air entry. No wheezing, rhonchi or rales.  Chest:     Chest wall: No tenderness.  Musculoskeletal:     Cervical back: Neck supple.  Lymphadenopathy:     Cervical: No cervical adenopathy.  Skin:    General: Skin is warm and dry.     Capillary Refill: Capillary refill takes less than 2 seconds.      Findings: No rash.  Neurological:     General: No focal deficit present.     Mental Status: She is alert and oriented to person, place, and time. Mental status is at baseline.  Cranial Nerves: No dysarthria or facial asymmetry.  Psychiatric:        Mood and Affect: Mood normal.        Speech: Speech normal.        Behavior: Behavior normal.        Thought Content: Thought content normal.        Judgment: Judgment normal.      UC Treatments / Results  Labs (all labs ordered are listed, but only abnormal results are displayed) Labs Reviewed  SARS CORONAVIRUS 2 (TAT 6-24 HRS)    EKG   Radiology No results found.  Procedures Procedures (including critical care time)  Medications Ordered in UC Medications  acetaminophen (TYLENOL) tablet 975 mg (975 mg Oral Given 10/26/22 0930)    Initial Impression / Assessment and Plan / UC Course  I have reviewed the triage vital signs and the nursing notes.  Pertinent labs & imaging results that were available during my care of the patient were reviewed by me and considered in my medical decision making (see chart for details).   1. Viral URI with cough, bad headache Symptoms and physical exam consistent with a viral upper respiratory tract infection that will likely resolve with rest, fluids, and prescriptions for symptomatic relief. Deferred imaging based on stable cardiopulmonary exam and hemodynamically stable vital signs. COVID-19 testing is pending.  We will call patient if this is positive.  Quarantine guidelines discussed. Currently on day 3 of symptoms and does qualify for antiviral therapy.   Patient given tylenol in clinic for headache.  Guaifenesin and tessalon perles sent to pharmacy for symptomatic relief to be taken as prescribed.  May continue taking over the counter medications as directed for further symptomatic relief.  Drowsiness precautions discussed regarding promethazine DM prescription.  Nonpharmacologic  interventions for symptom relief provided and after visit summary below. Advised to push fluids to stay well hydrated while recovering from viral illness.   Discussed physical exam and available lab work findings in clinic with patient.  Counseled patient regarding appropriate use of medications and potential side effects for all medications recommended or prescribed today. Discussed red flag signs and symptoms of worsening condition,when to call the PCP office, return to urgent care, and when to seek higher level of care in the emergency department. Patient verbalizes understanding and agreement with plan. All questions answered. Patient discharged in stable condition.     Final Clinical Impressions(s) / UC Diagnoses   Final diagnoses:  Viral URI with cough  Bad headache     Discharge Instructions      You have a viral upper respiratory infection.   Use the following medicines to help with symptoms: - Plain Mucinex (guaifenesin) over the counter as directed every 12 hours to thin mucous so that you are able to get it out of your body easier. Drink plenty of water while taking this medication so that it works well in your body (at least 8 cups a day).  - Tylenol 1,000mg  every 6 hours with food as needed for aches/pains or fever/chills.  - Tessalon perles every 8 hours as needed for cough.  1 tablespoon of honey in warm water and/or salt water gargles may also help with symptoms. Humidifier to your room will help add water to the air and reduce coughing.  If you develop any new or worsening symptoms, please return.  If your symptoms are severe, please go to the emergency room.  Follow-up with your primary care provider for further evaluation and management  of your symptoms as well as ongoing wellness visits.  I hope you feel better!    ED Prescriptions     Medication Sig Dispense Auth. Provider   guaiFENesin (MUCINEX) 600 MG 12 hr tablet Take 2 tablets (1,200 mg total) by mouth 2 (two)  times daily. 30 tablet Reita May M, FNP   benzonatate (TESSALON) 100 MG capsule Take 1 capsule (100 mg total) by mouth every 8 (eight) hours. 21 capsule Carlisle Beers, FNP      PDMP not reviewed this encounter.   Carlisle Beers, [place] 10/26/22 1319

## 2022-10-27 LAB — SARS CORONAVIRUS 2 (TAT 6-24 HRS): SARS Coronavirus 2: NEGATIVE

## 2022-11-06 ENCOUNTER — Telehealth: Payer: Self-pay

## 2022-11-06 DIAGNOSIS — E039 Hypothyroidism, unspecified: Secondary | ICD-10-CM

## 2022-11-06 NOTE — Telephone Encounter (Addendum)
Ok to do. Plz notify pt of change, rec schedule TSH lab visit in 2 months to ensure still at good range.

## 2022-11-06 NOTE — Addendum Note (Signed)
Addended by: Eustaquio Boyden on: 11/06/2022 05:19 PM   Modules accepted: Orders

## 2022-11-06 NOTE — Telephone Encounter (Signed)
Received faxed message from [facility] stating there is a manufacturer change in pt's levothyroxine. They are requesting permission to fill under new manufacturer.

## 2022-11-07 NOTE — Telephone Encounter (Signed)
Spoke with pharmacy notifying them Dr. Reece Agar is ok changing to new manufacturer. States they will document and make change at next refill.   Spoke with pt notifying her of levothyroxine manufacturer change. States pharmacy had made her aware. I also relay Dr. Timoteo Expose message. Pt verbalizes understanding and will call back to schedule 2 mo lab visit after starting with new manufacturer.

## 2023-01-21 ENCOUNTER — Ambulatory Visit: Payer: BC Managed Care – PPO | Admitting: Family Medicine

## 2023-01-21 ENCOUNTER — Encounter: Payer: Self-pay | Admitting: Family Medicine

## 2023-01-21 NOTE — Progress Notes (Deleted)
  Ph: [phone] Fax: [phone]   Patient ID: Ashley Boone, female    DOB: 07/30/1970, 52 y.o.   MRN: [id number]  This visit was conducted in person.  There were no vitals taken for this visit.   CC: follow up visit  Subjective:   HPI: Ashley Boone is a 52 y.o. female presenting on 01/21/2023 for No chief complaint on file.   ***      Relevant past medical, surgical, family and social history reviewed and updated as indicated. Interim medical history since our last visit reviewed. Allergies and medications reviewed and updated. Outpatient Medications Prior to Visit  Medication Sig Dispense Refill   atorvastatin (LIPITOR) 20 MG tablet Take 1 tablet (20 mg total) by mouth daily. 90 tablet 3   benzonatate (TESSALON) 100 MG capsule Take 1 capsule (100 mg total) by mouth every 8 (eight) hours. 21 capsule 0   fluticasone (FLONASE) 50 MCG/ACT nasal spray Place 2 sprays into both nostrils daily as needed for allergies. 16 g 6   gabapentin (NEURONTIN) 300 MG capsule Take 300 mg by mouth 3 (three) times daily.  5   guaiFENesin (MUCINEX) 600 MG 12 hr tablet Take 2 tablets (1,200 mg total) by mouth 2 (two) times daily. 30 tablet 0   levothyroxine (SYNTHROID) 150 MCG tablet Take 1 tablet (150 mcg total) by mouth daily before breakfast. 90 tablet 3   Multiple Vitamin (MULTIVITAMIN) tablet Take 1 tablet by mouth daily.     omeprazole (PRILOSEC) 40 MG capsule Take 40 mg by mouth 2 (two) times daily.     pseudoephedrine (SUDAFED) 120 MG 12 hr tablet Take 120 mg by mouth 2 (two) times daily.     No facility-administered medications prior to visit.     Per HPI unless specifically indicated in ROS section below Review of Systems  Objective:  There were no vitals taken for this visit.  Wt Readings from Last 3 Encounters:  02/23/22 229 lb 8 oz (104.1 kg)  08/02/20 231 lb 2 oz (104.8 kg)  05/02/18 228 lb (103.4 kg)      Physical Exam    Results for orders placed or performed  during the hospital encounter of 10/26/22  SARS CORONAVIRUS 2 (TAT 6-24 HRS) Anterior Nasal Swab   Specimen: Anterior Nasal Swab  Result Value Ref Range   SARS Coronavirus 2 NEGATIVE NEGATIVE    Assessment & Plan:   Problem List Items Addressed This Visit   None    No orders of the defined types were placed in this encounter.   No orders of the defined types were placed in this encounter.   There are no Patient Instructions on file for this visit.  Follow up plan: No follow-ups on file.  Eustaquio Boyden, MD

## 2023-01-22 ENCOUNTER — Encounter: Payer: Self-pay | Admitting: Family Medicine

## 2023-02-05 ENCOUNTER — Encounter: Payer: Self-pay | Admitting: Family Medicine

## 2023-02-05 ENCOUNTER — Ambulatory Visit (INDEPENDENT_AMBULATORY_CARE_PROVIDER_SITE_OTHER): Payer: BC Managed Care – PPO | Admitting: Family Medicine

## 2023-02-05 VITALS — BP 124/86 | HR 66 | Temp 97.7°F | Ht 67.0 in | Wt 236.2 lb

## 2023-02-05 DIAGNOSIS — E039 Hypothyroidism, unspecified: Secondary | ICD-10-CM

## 2023-02-05 DIAGNOSIS — E782 Mixed hyperlipidemia: Secondary | ICD-10-CM | POA: Diagnosis not present

## 2023-02-05 LAB — COMPREHENSIVE METABOLIC PANEL
ALT: 40 U/L — ABNORMAL HIGH (ref 0–35)
AST: 25 U/L (ref 0–37)
Albumin: 4.5 g/dL (ref 3.5–5.2)
Alkaline Phosphatase: 64 U/L (ref 39–117)
BUN: 12 mg/dL (ref 6–23)
CO2: 27 mEq/L (ref 19–32)
Calcium: 9.5 mg/dL (ref 8.4–10.5)
Chloride: 107 mEq/L (ref 96–112)
Creatinine, Ser: 0.89 mg/dL (ref 0.40–1.20)
GFR: 74.28 mL/min (ref >60.00)
Glucose, Bld: 120 mg/dL — ABNORMAL HIGH (ref 70–99)
Potassium: 4.7 mEq/L (ref 3.5–5.1)
Sodium: 143 mEq/L (ref 135–145)
Total Bilirubin: 0.5 mg/dL (ref 0.2–1.2)
Total Protein: 7.2 g/dL (ref 6.0–8.3)

## 2023-02-05 LAB — LIPID PANEL
Cholesterol: 165 mg/dL (ref 0–200)
HDL: 43.3 mg/dL (ref >39.00)
NonHDL: 122.03
Total CHOL/HDL Ratio: 4
Triglycerides: 247 mg/dL — ABNORMAL HIGH (ref 0.0–149.0)
VLDL: 49.4 mg/dL — ABNORMAL HIGH (ref 0.0–40.0)

## 2023-02-05 LAB — TSH: TSH: 0.56 u[IU]/mL (ref 0.35–5.50)

## 2023-02-05 LAB — LDL CHOLESTEROL, DIRECT: Direct LDL: 91 mg/dL

## 2023-02-05 MED ORDER — LEVOTHYROXINE SODIUM 150 MCG PO TABS: 150.0000 ug | ORAL_TABLET | Freq: Every day | ORAL | 1 refills | Status: DC

## 2023-02-05 MED ORDER — ATORVASTATIN CALCIUM 20 MG PO TABS: 20.0000 mg | ORAL_TABLET | Freq: Every day | ORAL | 1 refills | Status: DC

## 2023-02-05 NOTE — Assessment & Plan Note (Signed)
This was just lab visit - will check labwork.

## 2023-02-05 NOTE — Patient Instructions (Addendum)
Labs today.  Schedule physical after 02/24/2023.

## 2023-02-05 NOTE — Progress Notes (Signed)
Ph: [phone] Fax: [phone]   Patient ID: Ashley Boone, female    DOB: [birth year], 52 y.o.   MRN: [id number]  This visit was conducted in person.  BP 124/86   Pulse 66   Temp 97.7 F (36.5 C) (Temporal)   Ht 5\' 7"  (1.702 m)   Wt 236 lb 3.2 oz (107.1 kg)   SpO2 99%   BMI 36.99 kg/m    CC: follow up visit - converted to lab visit only as too soon for CPE Subjective:   HPI: Ashley Boone is a 52 y.o. female presenting on 02/05/2023 for Follow-up   Caring for mother who recently had LE varicose vein bleed s/p cauterization last night in ER.   Hypothyroidism - insurance changed formulary to new manufacturers 10/2022 - due for rpt labs.   Burning tongue on gabapentin 300mg  tid and omeprazole 40mg  bid, followed by Dr Christell Constant [facility].       Relevant past medical, surgical, family and social history reviewed and updated as indicated. Interim medical history since our last visit reviewed. Allergies and medications reviewed and updated. Outpatient Medications Prior to Visit  Medication Sig Dispense Refill   benzonatate (TESSALON) 100 MG capsule Take 1 capsule (100 mg total) by mouth every 8 (eight) hours. 21 capsule 0   fluticasone (FLONASE) 50 MCG/ACT nasal spray Place 2 sprays into both nostrils daily as needed for allergies. 16 g 6   gabapentin (NEURONTIN) 300 MG capsule Take 300 mg by mouth 3 (three) times daily.  5   Multiple Vitamin (MULTIVITAMIN) tablet Take 1 tablet by mouth daily.     omeprazole (PRILOSEC) 40 MG capsule Take 40 mg by mouth 2 (two) times daily.     atorvastatin (LIPITOR) 20 MG tablet Take 1 tablet (20 mg total) by mouth daily. 90 tablet 3   levothyroxine (SYNTHROID) 150 MCG tablet Take 1 tablet (150 mcg total) by mouth daily before breakfast. 90 tablet 3   ferrous sulfate 324 (65 Fe) MG TBEC Take 1 tablet (325 mg total) by mouth daily. 30 tablet    guaiFENesin (MUCINEX) 600 MG 12 hr tablet Take 2 tablets (1,200 mg total) by mouth 2 (two)  times daily. (Patient not taking: Reported on 02/05/2023) 30 tablet 0   pseudoephedrine (SUDAFED) 120 MG 12 hr tablet Take 120 mg by mouth 2 (two) times daily. (Patient not taking: Reported on 02/05/2023)     No facility-administered medications prior to visit.     Per HPI unless specifically indicated in ROS section below Review of Systems  Objective:  BP 124/86   Pulse 66   Temp 97.7 F (36.5 C) (Temporal)   Ht 5\' 7"  (1.702 m)   Wt 236 lb 3.2 oz (107.1 kg)   SpO2 99%   BMI 36.99 kg/m   Wt Readings from Last 3 Encounters:  02/05/23 236 lb 3.2 oz (107.1 kg)  02/23/22 229 lb 8 oz (104.1 kg)  08/02/20 231 lb 2 oz (104.8 kg)      Physical Exam    Results for orders placed or performed during the hospital encounter of 10/26/22  SARS CORONAVIRUS 2 (TAT 6-24 HRS) Anterior Nasal Swab   Specimen: Anterior Nasal Swab  Result Value Ref Range   SARS Coronavirus 2 NEGATIVE NEGATIVE    Assessment & Plan:   Problem List Items Addressed This Visit     Hypothyroidism    This was just lab visit - will check labwork.  Relevant Medications   levothyroxine (SYNTHROID) 150 MCG tablet   Mixed hyperlipidemia - Primary   Relevant Medications   atorvastatin (LIPITOR) 20 MG tablet   Other Relevant Orders   Lipid panel   Comprehensive metabolic panel     Meds ordered this encounter  Medications   atorvastatin (LIPITOR) 20 MG tablet    Sig: Take 1 tablet (20 mg total) by mouth daily.    Dispense:  90 tablet    Refill:  1   levothyroxine (SYNTHROID) 150 MCG tablet    Sig: Take 1 tablet (150 mcg total) by mouth daily before breakfast.    Dispense:  90 tablet    Refill:  1    Orders Placed This Encounter  Procedures   Lipid panel   Comprehensive metabolic panel    Patient Instructions  Labs today.  Schedule physical after 02/24/2023.    Follow up plan: No follow-ups on file.  Eustaquio Boyden, MD

## 2023-02-14 ENCOUNTER — Telehealth: Payer: Self-pay

## 2023-02-14 NOTE — Telephone Encounter (Signed)
Per Dr Reece Agar, pt she needs CPE after 02/24/2023. Recent lab results will be discussed then.   Also, pt overdue for colon cancer screening.

## 2023-02-14 NOTE — Telephone Encounter (Signed)
Noted  

## 2023-02-14 NOTE — Telephone Encounter (Signed)
Patient scheduled.

## 2023-03-20 ENCOUNTER — Encounter: Payer: Self-pay | Admitting: Family Medicine

## 2023-03-20 ENCOUNTER — Ambulatory Visit (INDEPENDENT_AMBULATORY_CARE_PROVIDER_SITE_OTHER): Payer: BC Managed Care – PPO | Admitting: Family Medicine

## 2023-03-20 VITALS — BP 128/88 | HR 68 | Temp 98.7°F | Ht 67.0 in | Wt 232.0 lb

## 2023-03-20 DIAGNOSIS — K219 Gastro-esophageal reflux disease without esophagitis: Secondary | ICD-10-CM

## 2023-03-20 DIAGNOSIS — Z6836 Body mass index (BMI) 36.0-36.9, adult: Secondary | ICD-10-CM

## 2023-03-20 DIAGNOSIS — E782 Mixed hyperlipidemia: Secondary | ICD-10-CM

## 2023-03-20 DIAGNOSIS — G919 Hydrocephalus, unspecified: Secondary | ICD-10-CM | POA: Diagnosis not present

## 2023-03-20 DIAGNOSIS — Z1211 Encounter for screening for malignant neoplasm of colon: Secondary | ICD-10-CM

## 2023-03-20 DIAGNOSIS — Z0000 Encounter for general adult medical examination without abnormal findings: Secondary | ICD-10-CM | POA: Diagnosis not present

## 2023-03-20 DIAGNOSIS — Z23 Encounter for immunization: Secondary | ICD-10-CM | POA: Diagnosis not present

## 2023-03-20 DIAGNOSIS — K146 Glossodynia: Secondary | ICD-10-CM

## 2023-03-20 DIAGNOSIS — E039 Hypothyroidism, unspecified: Secondary | ICD-10-CM

## 2023-03-20 MED ORDER — LEVOTHYROXINE SODIUM 150 MCG PO TABS: 150.0000 ug | ORAL_TABLET | Freq: Every day | ORAL | 4 refills | Status: DC

## 2023-03-20 MED ORDER — ATORVASTATIN CALCIUM 20 MG PO TABS: 20.0000 mg | ORAL_TABLET | Freq: Every day | ORAL | 4 refills | Status: DC

## 2023-03-20 NOTE — Assessment & Plan Note (Signed)
Manages with PPI BID and gabapentin TID

## 2023-03-20 NOTE — Progress Notes (Signed)
Ph: [phone] Fax: [phone]   Patient ID: Ashley Boone, female    DOB: [birth year], 52 y.o.   MRN: [id number]  This visit was conducted in person.  BP 128/88 (BP Location: Left Arm, Patient Position: Sitting, Cuff Size: Normal)   Pulse 68   Temp 98.7 F (37.1 C) (Oral)   Ht 5\' 7"  (1.702 m)   Wt 232 lb (105.2 kg)   SpO2 98%   BMI 36.34 kg/m    CC: CPE Subjective:   HPI: Ashley Boone is a 52 y.o. female presenting on 03/20/2023 for Annual Exam (No other concerns at the moment)   Continues caring for mother.   Hypothyroidism - insurance changed formulary to new manufacturers 10/2022 - due for rpt labs.  Burning tongue on gabapentin 300mg  tid and omeprazole 40mg  bid, followed by Dr Christell Constant [facility].   Saw neurosurgeon - Cabbell - told no follow up unless new issues develop.    Preventative: Colon cancer screening - iFOB negative 08/2020 - agrees to rpt iFOB Well woman exam with [facility] Dr Ambrose Mantle (retired) with normal paps latest 01/2021 - due for follow up - will call to schedule.  Mammo through OBGYN last 01/2021 Lung cancer screening - not eligible  Flu shot - yearly COVID vaccine - discussed, declines  Tetanus shot - unsure. Tdap ?2019 - not documented  Shingrix - discussed  Seat belt use discussed  Sunscreen use discussed. 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 Interim medical history since our last visit reviewed. Allergies and medications reviewed and updated. Outpatient Medications Prior to Visit  Medication Sig Dispense Refill   fluticasone (FLONASE) 50 MCG/ACT nasal spray Place 2  sprays into both nostrils daily as needed for allergies. 16 g 6   Multiple Vitamin (MULTIVITAMIN) tablet Take 1 tablet by mouth daily.     atorvastatin (LIPITOR) 20 MG tablet Take 1 tablet (20 mg total) by mouth daily. 90 tablet 1   benzonatate (TESSALON) 100 MG capsule Take 1 capsule (100 mg total) by mouth every 8 (eight) hours. 21 capsule 0   gabapentin (NEURONTIN) 300 MG capsule Take 300 mg by mouth 3 (three) times daily.  5   levothyroxine (SYNTHROID) 150 MCG tablet Take 1 tablet (150 mcg total) by mouth daily before breakfast. 90 tablet 1   omeprazole (PRILOSEC) 40 MG capsule Take 40 mg by mouth 2 (two) times daily.     gabapentin (NEURONTIN) 300 MG capsule Take 1 capsule (300 mg total) by mouth 3 (three) times daily.     omeprazole (PRILOSEC) 40 MG capsule Take 1 capsule (40 mg total) by mouth 2 (two) times daily.     No facility-administered medications prior to visit.     Per HPI unless specifically indicated in ROS section below Review of Systems  Constitutional:  Negative for activity change, appetite change, chills, fatigue, fever and unexpected weight change.  HENT:  Negative for hearing loss.   Eyes:  Negative for visual disturbance.  Respiratory:  Negative for cough, chest tightness, shortness of breath and wheezing.   Cardiovascular:  Negative for chest pain, palpitations and  leg swelling.  Gastrointestinal:  Negative for abdominal distention, abdominal pain, blood in stool, constipation, diarrhea, nausea and vomiting.  Genitourinary:  Negative for difficulty urinating and hematuria.  Musculoskeletal:  Negative for arthralgias, myalgias and neck pain.  Skin:  Negative for rash.  Neurological:  Negative for dizziness, seizures, syncope and headaches.  Hematological:  Negative for adenopathy. Does not bruise/bleed easily.  Psychiatric/Behavioral:  Negative for dysphoric mood. The patient is not nervous/anxious.     Objective:  BP 128/88 (BP Location: Left Arm, Patient  Position: Sitting, Cuff Size: Normal)   Pulse 68   Temp 98.7 F (37.1 C) (Oral)   Ht 5\' 7"  (1.702 m)   Wt 232 lb (105.2 kg)   SpO2 98%   BMI 36.34 kg/m   Wt Readings from Last 3 Encounters:  03/20/23 232 lb (105.2 kg)  02/05/23 236 lb 3.2 oz (107.1 kg)  02/23/22 229 lb 8 oz (104.1 kg)      Physical Exam Vitals and nursing note reviewed.  Constitutional:      Appearance: Normal appearance. She is not ill-appearing.  HENT:     Head: Normocephalic and atraumatic.     Right Ear: Tympanic membrane, ear canal and external ear normal. There is no impacted cerumen.     Left Ear: Tympanic membrane, ear canal and external ear normal. There is no impacted cerumen.     Mouth/Throat:     Mouth: Mucous membranes are moist.     Pharynx: Oropharynx is clear. No oropharyngeal exudate or posterior oropharyngeal erythema.  Eyes:     General:        Right eye: No discharge.        Left eye: No discharge.     Extraocular Movements: Extraocular movements intact.     Conjunctiva/sclera: Conjunctivae normal.     Pupils: Pupils are equal, round, and reactive to light.  Neck:     Thyroid: No thyroid mass or thyromegaly.  Cardiovascular:     Rate and Rhythm: Normal rate and regular rhythm.     Pulses: Normal pulses.     Heart sounds: Normal heart sounds. No murmur heard. Pulmonary:     Effort: Pulmonary effort is normal. No respiratory distress.     Breath sounds: Normal breath sounds. No wheezing, rhonchi or rales.  Abdominal:     General: Bowel sounds are normal. There is no distension.     Palpations: Abdomen is soft. There is no mass.     Tenderness: There is no abdominal tenderness. There is no guarding or rebound.     Hernia: No hernia is present.  Musculoskeletal:     Cervical back: Normal range of motion and neck supple. No rigidity.     Right lower leg: No edema.     Left lower leg: No edema.  Lymphadenopathy:     Cervical: No cervical adenopathy.  Skin:    General: Skin is warm  and dry.     Findings: No rash.  Neurological:     General: No focal deficit present.     Mental Status: She is alert. Mental status is at baseline.  Psychiatric:        Mood and Affect: Mood normal.        Behavior: Behavior normal.       Results for orders placed or performed in visit on 02/05/23  TSH  Result Value Ref Range   TSH 0.56 0.35 - 5.50 uIU/mL  Lipid panel  Result Value Ref Range   Cholesterol  165 0 - 200 mg/dL   Triglycerides 161.0 (H) 0.0 - 149.0 mg/dL   HDL 96.04 >54.09 mg/dL   VLDL 81.1 (H) 0.0 - 91.4 mg/dL   Total CHOL/HDL Ratio 4    NonHDL 122.03   Comprehensive metabolic panel  Result Value Ref Range   Sodium 143 135 - 145 mEq/L   Potassium 4.7 3.5 - 5.1 mEq/L   Chloride 107 96 - 112 mEq/L   CO2 27 19 - 32 mEq/L   Glucose, Bld 120 (H) 70 - 99 mg/dL   BUN 12 6 - 23 mg/dL   Creatinine, Ser 7.82 0.40 - 1.20 mg/dL   Total Bilirubin 0.5 0.2 - 1.2 mg/dL   Alkaline Phosphatase 64 39 - 117 U/L   AST 25 0 - 37 U/L   ALT 40 (H) 0 - 35 U/L   Total Protein 7.2 6.0 - 8.3 g/dL   Albumin 4.5 3.5 - 5.2 g/dL   GFR 95.62 >13.08 mL/min   Calcium 9.5 8.4 - 10.5 mg/dL  LDL cholesterol, direct  Result Value Ref Range   Direct LDL 91.0 mg/dL    Assessment & Plan:   Problem List Items Addressed This Visit     Health maintenance examination - Primary (Chronic)    Preventative protocols reviewed and updated unless pt declined. Discussed healthy diet and lifestyle.       Hypothyroidism    Chronic, stable. Continue current regimen.       Relevant Medications   levothyroxine (SYNTHROID) 150 MCG tablet   Hydrocephalus (HCC)    VP shunt in place.       GERD (gastroesophageal reflux disease)    Manages with PPI BID      Relevant Medications   omeprazole (PRILOSEC) 40 MG capsule   Severe obesity (BMI 35.0-39.9) with comorbidity (HCC)    Continue to encourage healthy diet and lifestyle choices to affect sustainable weight loss.       Mixed hyperlipidemia     Chronic, stable on atorvastatin 20mg  daily with LDL 90s. Continue this. Reviewed diet choices to improve triglyceride control.  The 10-year ASCVD risk score (Arnett DK, et al., 2019) is: 1.7%   Values used to calculate the score:     Age: 64 years     Sex: Female     Is Non-Hispanic African American: No     Diabetic: No     Tobacco smoker: No     Systolic Blood Pressure: 128 mmHg     Is BP treated: No     HDL Cholesterol: 43.3 mg/dL     Total Cholesterol: 165 mg/dL       Relevant Medications   atorvastatin (LIPITOR) 20 MG tablet   Burning tongue syndrome    Manages with PPI BID and gabapentin TID      Other Visit Diagnoses     Need for vaccination       Relevant Orders   Flu vaccine trivalent PF, 6mos and older(Flulaval,Afluria,Fluarix,Fluzone) (Completed)   Zoster Recombinant (Shingrix ) (Completed)   Special screening for malignant neoplasms, colon       Relevant Orders   Fecal occult blood, imunochemical        Meds ordered this encounter  Medications   levothyroxine (SYNTHROID) 150 MCG tablet    Sig: Take 1 tablet (150 mcg total) by mouth daily before breakfast.    Dispense:  90 tablet    Refill:  4   atorvastatin (LIPITOR) 20 MG tablet    Sig: Take 1 tablet (  20 mg total) by mouth daily.    Dispense:  90 tablet    Refill:  4    Orders Placed This Encounter  Procedures   Fecal occult blood, imunochemical    Standing Status:   Future    Standing Expiration Date:   03/19/2024   Flu vaccine trivalent PF, 6mos and older(Flulaval,Afluria,Fluarix,Fluzone)   Zoster Recombinant (Shingrix )    Patient Instructions  Flu shot today  First shingles shot today. Return in 2-6 months for nurse visit for second and final shingles shot.  Pass by lab to pick up stool kit.  Call to schedule well woman exam and mammogram as you're due.  Check up front about next mobile mammogram date in our office.  Return as needed or in 1 year for next physical.   Follow up  plan: Return in about 1 year (around 03/19/2024) for annual exam, prior fasting for blood work.  Eustaquio Boyden, MD

## 2023-03-20 NOTE — Assessment & Plan Note (Signed)
Chronic, stable. Continue current regimen. 

## 2023-03-20 NOTE — Assessment & Plan Note (Signed)
Continue to encourage healthy diet and lifestyle choices to affect sustainable weight loss.  °

## 2023-03-20 NOTE — Assessment & Plan Note (Signed)
Preventative protocols reviewed and updated unless pt declined. Discussed healthy diet and lifestyle.  

## 2023-03-20 NOTE — Assessment & Plan Note (Signed)
Manages with PPI BID

## 2023-03-20 NOTE — Assessment & Plan Note (Signed)
VP shunt in place.  

## 2023-03-20 NOTE — Assessment & Plan Note (Signed)
Chronic, stable on atorvastatin 20mg  daily with LDL 90s. Continue this. Reviewed diet choices to improve triglyceride control.  The 10-year ASCVD risk score (Arnett DK, et al., 2019) is: 1.7%   Values used to calculate the score:     Age: 52 years     Sex: Female     Is Non-Hispanic African American: No     Diabetic: No     Tobacco smoker: No     Systolic Blood Pressure: 128 mmHg     Is BP treated: No     HDL Cholesterol: 43.3 mg/dL     Total Cholesterol: 165 mg/dL

## 2023-03-20 NOTE — Patient Instructions (Addendum)
Flu shot today  First shingles shot today. Return in 2-6 months for nurse visit for second and final shingles shot.  Pass by lab to pick up stool kit.  Call to schedule well woman exam and mammogram as you're due.  Check up front about next mobile mammogram date in our office.  Return as needed or in 1 year for next physical.

## 2023-04-02 ENCOUNTER — Other Ambulatory Visit (INDEPENDENT_AMBULATORY_CARE_PROVIDER_SITE_OTHER): Payer: Self-pay

## 2023-04-02 DIAGNOSIS — Z1211 Encounter for screening for malignant neoplasm of colon: Secondary | ICD-10-CM

## 2023-04-03 ENCOUNTER — Telehealth: Payer: Self-pay

## 2023-04-03 DIAGNOSIS — R195 Other fecal abnormalities: Secondary | ICD-10-CM

## 2023-04-03 LAB — FECAL OCCULT BLOOD, IMMUNOCHEMICAL: Fecal Occult Bld: POSITIVE — AB

## 2023-04-03 NOTE — Telephone Encounter (Signed)
Spoke with pt relaying Dr. G's message. Pt verbalizes understanding.  

## 2023-04-03 NOTE — Telephone Encounter (Signed)
Jacki Cones from [facility] called report + ifob which is in Epic. Sending note to Dr Reece Agar and G pool.

## 2023-04-03 NOTE — Telephone Encounter (Signed)
Plz notify stool test returned positive for blood therefore recommend referral to GI/colonoscopy. Referral placed

## 2023-05-14 ENCOUNTER — Encounter: Payer: Self-pay | Admitting: Gastroenterology

## 2023-05-14 ENCOUNTER — Ambulatory Visit: Payer: BC Managed Care – PPO | Admitting: Gastroenterology

## 2023-05-14 ENCOUNTER — Other Ambulatory Visit (INDEPENDENT_AMBULATORY_CARE_PROVIDER_SITE_OTHER): Payer: BC Managed Care – PPO

## 2023-05-14 VITALS — BP 122/84 | HR 74 | Ht 67.0 in | Wt 241.5 lb

## 2023-05-14 DIAGNOSIS — Z1211 Encounter for screening for malignant neoplasm of colon: Secondary | ICD-10-CM

## 2023-05-14 DIAGNOSIS — R195 Other fecal abnormalities: Secondary | ICD-10-CM

## 2023-05-14 LAB — CBC WITH DIFFERENTIAL/PLATELET
Basophils Absolute: 0.1 10*3/uL (ref 0.0–0.1)
Basophils Relative: 1 % (ref 0.0–3.0)
Eosinophils Absolute: 0.1 10*3/uL (ref 0.0–0.7)
Eosinophils Relative: 1.9 % (ref 0.0–5.0)
HCT: 45.3 % (ref 36.0–46.0)
Hemoglobin: 15 g/dL (ref 12.0–15.0)
Lymphocytes Relative: 36 % (ref 12.0–46.0)
Lymphs Abs: 2.6 10*3/uL (ref 0.7–4.0)
MCHC: 33 g/dL (ref 30.0–36.0)
MCV: 89.5 fL (ref 78.0–100.0)
Monocytes Absolute: 0.5 10*3/uL (ref 0.1–1.0)
Monocytes Relative: 6.9 % (ref 3.0–12.0)
Neutro Abs: 3.8 10*3/uL (ref 1.4–7.7)
Neutrophils Relative %: 54.2 % (ref 43.0–77.0)
Platelets: 305 10*3/uL (ref 150.0–400.0)
RBC: 5.06 Mil/uL (ref 3.87–5.11)
RDW: 13.8 % (ref 11.5–15.5)
WBC: 7.1 10*3/uL (ref 4.0–10.5)

## 2023-05-14 LAB — IBC + FERRITIN
Ferritin: 30.8 ng/mL (ref 10.0–291.0)
Iron: 76 ug/dL (ref 42–145)
Saturation Ratios: 16.7 % — ABNORMAL LOW (ref 20.0–50.0)
TIBC: 455 ug/dL — ABNORMAL HIGH (ref 250.0–450.0)
Transferrin: 325 mg/dL (ref 212.0–360.0)

## 2023-05-14 MED ORDER — NA SULFATE-K SULFATE-MG SULF 17.5-3.13-1.6 GM/177ML PO SOLN: 1.0000 | Freq: Once | ORAL | 0 refills | Status: AC

## 2023-05-14 NOTE — Patient Instructions (Signed)
Your provider has requested that you go to the basement level for lab work before leaving today. Press "B" on the elevator. The lab is located at the first door on the left as you exit the elevator.  Due to recent changes in healthcare laws, you may see the results of your imaging and laboratory studies on MyChart before your provider has had a chance to review them.  We understand that in some cases there may be results that are confusing or concerning to you. Not all laboratory results come back in the same time frame and the provider may be waiting for multiple results in order to interpret others.  Please give [place] 48 hours in order for your provider to thoroughly review all the results before contacting the office for clarification of your results.   You have been scheduled for an endoscopy and colonoscopy. Please follow the written instructions given to you at your visit today.  Please pick up your prep supplies at the pharmacy within the next 1-3 days.  If you use inhalers (even only as needed), please bring them with you on the day of your procedure.  DO NOT TAKE 7 DAYS PRIOR TO TEST- Trulicity (dulaglutide) Ozempic, Wegovy (semaglutide) Mounjaro (tirzepatide) Bydureon Bcise (exanatide extended release)  DO NOT TAKE 1 DAY PRIOR TO YOUR TEST Rybelsus (semaglutide) Adlyxin (lixisenatide) Victoza (liraglutide) Byetta (exanatide) ___________________________________________________________________________   I appreciate the opportunity to care for you. Boone Master, PA-C

## 2023-05-14 NOTE — Progress Notes (Signed)
Chief Complaint: positive hemoccult Primary GI MD: Gentry Fitz  HPI: 52 year old female with medical history as listed below presents for evaluation of positive Hemoccult.  Patient recently had positive screening Hemoccult with PCP.  Last year was negative.  No previous history of colonoscopy. Recent labs normal CMP and TSH.  No CBC in the system noted.  She states many many years ago at her gynecologist she was told she had iron deficiency anemia and was on an iron pill.  Patient denies overt bleeding.  Denies change in bowel habits, nausea, vomiting, family history of colon cancer.  Rare GERD that is controlled with lifestyle modifications.  Denies weight loss.  This positive Hemoccult concerns patient  PREVIOUS GI WORKUP   none  Past Medical History:  Diagnosis Date   Anemia    Depression    GERD (gastroesophageal reflux disease)    Hydrocephalus (HCC)    shunt (Cabbell)   Hyperlipidemia    Hypothyroidism    Obesity     Past Surgical History:  Procedure Laterality Date   ANKLE SURGERY Left 1996   with plates and screws   VENTRICULOPERITONEAL SHUNT  [phone]   with revisions, Cabbell    Current Outpatient Medications  Medication Sig Dispense Refill   atorvastatin (LIPITOR) 20 MG tablet Take 1 tablet (20 mg total) by mouth daily. 90 tablet 4   fluticasone (FLONASE) 50 MCG/ACT nasal spray Place 2 sprays into both nostrils daily as needed for allergies. 16 g 6   gabapentin (NEURONTIN) 300 MG capsule Take 1 capsule (300 mg total) by mouth 3 (three) times daily.     levothyroxine (SYNTHROID) 150 MCG tablet Take 1 tablet (150 mcg total) by mouth daily before breakfast. 90 tablet 4   Multiple Vitamin (MULTIVITAMIN) tablet Take 1 tablet by mouth daily.     omeprazole (PRILOSEC) 40 MG capsule Take 1 capsule (40 mg total) by mouth 2 (two) times daily.     No current facility-administered medications for this visit.    Allergies as of 05/14/2023   (No Known  Allergies)    Family History  Problem Relation Age of Onset   CAD Father 11       MI   Hypertension Father    Diabetes Father    Cancer Neg Hx    Stroke Neg Hx     Social History   Socioeconomic History   Marital status: Married    Spouse name: Not on file   Number of children: 3   Years of education: Not on file   Highest education level: Not on file  Occupational History   Not on file  Tobacco Use   Smoking status: Never   Smokeless tobacco: Never  Substance and Sexual Activity   Alcohol use: No   Drug use: No   Sexual activity: Yes  Other Topics Concern   Not on file  Social History Narrative   Lives with and son (19yo), daughter (31yo), daughter (8yo), and FIL (93yo)   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 [employer]   Activity: walks dog   Diet: good water, fruits/vegetables daily   Social Determinants of Corporate investment banker Strain: Not on file  Food Insecurity: Not on file  Transportation Needs: Not on file  Physical Activity: Not on file  Stress: Not on file  Social Connections: Not on file  Intimate Partner Violence: Not on file    Review of Systems:    Constitutional: No weight loss, fever, chills, weakness or  fatigue HEENT: Eyes: No change in vision               Ears, Nose, Throat:  No change in hearing or congestion Skin: No rash or itching Cardiovascular: No chest pain, chest pressure or palpitations   Respiratory: No SOB or cough Gastrointestinal: See HPI and otherwise negative Genitourinary: No dysuria or change in urinary frequency Neurological: No headache, dizziness or syncope Musculoskeletal: No new muscle or joint pain Hematologic: No bleeding or bruising Psychiatric: No history of depression or anxiety    Physical Exam:  Vital signs: BP 122/84   Pulse 74   Ht 5\' 7"  (1.702 m)   Wt 241 lb 8 oz (109.5 kg)   SpO2 98%   BMI 37.82 kg/m   Constitutional: NAD, Well developed, Well nourished, alert and  cooperative Head:  Normocephalic and atraumatic. Eyes:   PEERL, EOMI. No icterus. Conjunctiva pink. Respiratory: Respirations even and unlabored. Lungs clear to auscultation bilaterally.   No wheezes, crackles, or rhonchi.  Cardiovascular:  Regular rate and rhythm. No peripheral edema, cyanosis or pallor.  Gastrointestinal:  Soft, nondistended, nontender. No rebound or guarding. Normal bowel sounds. No appreciable masses or hepatomegaly. Rectal:  Not performed.  Msk:  Symmetrical without gross deformities. Without edema, no deformity or joint abnormality.  Neurologic:  Alert and  oriented x4;  grossly normal neurologically.  Skin:   Dry and intact without significant lesions or rashes. Psychiatric: Oriented to person, place and time. Demonstrates good judgement and reason without abnormal affect or behaviors.   RELEVANT LABS AND IMAGING: CBC No results found for: "WBC", "RBC", "HGB", "HCT", "PLT", "MCV", "MCH", "MCHC", "RDW", "LYMPHSABS", "MONOABS", "EOSABS", "BASOSABS"  CMP     Component Value Date/Time   NA 143 02/05/2023 1005   K 4.7 02/05/2023 1005   CL 107 02/05/2023 1005   CO2 27 02/05/2023 1005   GLUCOSE 120 (H) 02/05/2023 1005   BUN 12 02/05/2023 1005   CREATININE 0.89 02/05/2023 1005   CALCIUM 9.5 02/05/2023 1005   PROT 7.2 02/05/2023 1005   ALBUMIN 4.5 02/05/2023 1005   AST 25 02/05/2023 1005   ALT 40 (H) 02/05/2023 1005   ALKPHOS 64 02/05/2023 1005   BILITOT 0.5 02/05/2023 1005     Assessment/Plan:   Positive Hemoccult Colon cancer screening No previous colonoscopy.  Positive Hemoccult with PCP.  No overt bleeding or symptoms.  No baseline CBC in the system dating all the way back to 2015. - Schedule colonoscopy - check CBC, iron/ferritin - If patient is found to have iron deficiency anemia would add on EGD to colonoscopy for history of iron deficiency anemia. - I thoroughly discussed the procedure with the patient (at bedside) to include nature of the  procedure, alternatives, benefits, and risks (including but not limited to bleeding, infection, perforation, anesthesia/cardiac pulmonary complications).  Patient verbalized understanding and gave verbal consent to proceed with procedure.    Lara Mulch [facility] 05/14/2023, 2:33 PM  Cc: Eustaquio Boyden, MD

## 2023-05-19 HISTORY — PX: COLONOSCOPY: SHX174

## 2023-05-20 ENCOUNTER — Encounter: Payer: Self-pay | Admitting: Gastroenterology

## 2023-05-21 ENCOUNTER — Ambulatory Visit (INDEPENDENT_AMBULATORY_CARE_PROVIDER_SITE_OTHER): Payer: BC Managed Care – PPO

## 2023-05-21 DIAGNOSIS — Z23 Encounter for immunization: Secondary | ICD-10-CM

## 2023-05-21 NOTE — Progress Notes (Signed)
Agree with the assessment and plan as outlined by Boone Master, PA-C.

## 2023-05-21 NOTE — Progress Notes (Signed)
Per orders of Dr. Javier Gutierrez, injection of shingrix given by Raechell Singleton in right deltoid. Patient tolerated injection well. .   

## 2023-05-30 ENCOUNTER — Encounter: Payer: Self-pay | Admitting: Gastroenterology

## 2023-05-30 ENCOUNTER — Ambulatory Visit: Payer: BC Managed Care – PPO | Admitting: Gastroenterology

## 2023-05-30 VITALS — BP 143/89 | HR 67 | Temp 97.9°F | Resp 17 | Ht 67.0 in | Wt 241.8 lb

## 2023-05-30 DIAGNOSIS — Z1211 Encounter for screening for malignant neoplasm of colon: Secondary | ICD-10-CM | POA: Diagnosis not present

## 2023-05-30 MED ORDER — SODIUM CHLORIDE 0.9 % IV SOLN: 500.0000 mL | INTRAVENOUS | Status: DC

## 2023-05-30 NOTE — Progress Notes (Signed)
Pt's states no medical or surgical changes since previsit or office visit. 

## 2023-05-30 NOTE — Progress Notes (Signed)
A/O x 3, gd SR's, VSS, report to RN

## 2023-05-30 NOTE — Progress Notes (Signed)
History and Physical Interval Note:  05/30/2023 3:41 PM  Ashley Boone  has presented today for endoscopic procedure(s), with the diagnosis of  Encounter Diagnosis  Name Primary?   Colon cancer screening Yes  .  The various methods of evaluation and treatment have been discussed with the patient and/or family. After consideration of risks, benefits and other options for treatment, the patient has consented to  the endoscopic procedure(s).   The patient's history has been reviewed, patient examined, no change in status, stable for endoscopic procedure(s).  I have reviewed the patient's chart and labs.  Questions were answered to the patient's satisfaction.    Patient states she needs to keep head slightly elevated due to VP shunt.  Will plan to keep HOB elevated during procedure.   Shaterria Sager E. Tomasa Rand, MD [facility]

## 2023-05-30 NOTE — Patient Instructions (Addendum)
Handouts Provided:  Polyps and Diverticulosis  REPEAT Colonoscopy in 10 years for screening purposes.  YOU HAD AN ENDOSCOPIC PROCEDURE TODAY AT THE [facility]:   Refer to the procedure report that was given to you for any specific questions about what was found during the examination.  If the procedure report does not answer your questions, please call your gastroenterologist to clarify.  If you requested that your care partner not be given the details of your procedure findings, then the procedure report has been included in a sealed envelope for you to review at your convenience later.  YOU SHOULD EXPECT: Some feelings of bloating in the abdomen. Passage of more gas than usual.  Walking can help get rid of the air that was put into your GI tract during the procedure and reduce the bloating. If you had a lower endoscopy (such as a colonoscopy or flexible sigmoidoscopy) you may notice spotting of blood in your stool or on the toilet paper. If you underwent a bowel prep for your procedure, you may not have a normal bowel movement for a few days.  Please Note:  You might notice some irritation and congestion in your nose or some drainage.  This is from the oxygen used during your procedure.  There is no need for concern and it should clear up in a day or so.  SYMPTOMS TO REPORT IMMEDIATELY:  Following lower endoscopy (colonoscopy or flexible sigmoidoscopy):  Excessive amounts of blood in the stool  Significant tenderness or worsening of abdominal pains  Swelling of the abdomen that is new, acute  Fever of 100F or higher  For urgent or emergent issues, a gastroenterologist can be reached at any hour by calling [phone]. Do not use MyChart messaging for urgent concerns.    DIET:  We do recommend a small meal at first, but then you may proceed to your regular diet.  Drink plenty of fluids but you should avoid alcoholic beverages for 24 hours.  ACTIVITY:  You should plan to  take it easy for the rest of today and you should NOT DRIVE or use heavy machinery until tomorrow (because of the sedation medicines used during the test).    FOLLOW UP: Our staff will call the number listed on your records the next business day following your procedure.  We will call around 7:15- 8:00 am to check on you and address any questions or concerns that you may have regarding the information given to you following your procedure. If we do not reach you, we will leave a message.     If any biopsies were taken you will be contacted by phone or by letter within the next 1-3 weeks.  Please call us at [phone] if you have not heard about the biopsies in 3 weeks.    SIGNATURES/CONFIDENTIALITY: You and/or your care partner have signed paperwork which will be entered into your electronic medical record.  These signatures attest to the fact that that the information above on your After Visit Summary has been reviewed and is understood.  Full responsibility of the confidentiality of this discharge information lies with you and/or your care-partner.

## 2023-05-30 NOTE — Op Note (Signed)
[facility] Patient Name: Ashley Boone Procedure Date: 05/30/2023 3:41 PM MRN: [id number] Endoscopist: Lorin Picket E. Tomasa Rand , MD, [id number] Age: 52 Referring MD:  Date of Birth: [birth year] Gender: Female Account #: [id number] Procedure:                Colonoscopy Indications:              Screening for colorectal malignant neoplasm, This                            is the patient's first colonoscopy Medicines:                Monitored Anesthesia Care Procedure:                Pre-Anesthesia Assessment:                           - Prior to the procedure, a History and Physical                            was performed, and patient medications and                            allergies were reviewed. The patient's tolerance of                            previous anesthesia was also reviewed. The risks                            and benefits of the procedure and the sedation                            options and risks were discussed with the patient.                            All questions were answered, and informed consent                            was obtained. Prior Anticoagulants: The patient has                            taken no anticoagulant or antiplatelet agents. ASA                            Grade Assessment: III - A patient with severe                            systemic disease. After reviewing the risks and                            benefits, the patient was deemed in satisfactory                            condition to undergo the procedure.  After obtaining informed consent, the colonoscope                            was passed under direct vision. Throughout the                            procedure, the patient's blood pressure, pulse, and                            oxygen saturations were monitored continuously. The                            CF [id number] #[id number] was introduced through the anus                            and  advanced to the the cecum, identified by                            appendiceal orifice and ileocecal valve. The                            colonoscopy was somewhat difficult due to                            significant looping. Successful completion of the                            procedure was aided by using manual pressure. The                            patient tolerated the procedure well. The quality                            of the bowel preparation was good. The ileocecal                            valve, appendiceal orifice, and rectum were                            photographed. The bowel preparation used was SUPREP                            via split dose instruction. Scope In: 3:52:11 PM Scope Out: 4:02:22 PM Scope Withdrawal Time: 0 hours 6 minutes 11 seconds  Total Procedure Duration: 0 hours 10 minutes 11 seconds  Findings:                 The perianal and digital rectal examinations were                            normal. Pertinent negatives include normal                            sphincter tone and no palpable rectal lesions.  Many medium-mouthed and small-mouthed diverticula                            were found in the sigmoid colon and descending                            colon.                           The exam was otherwise normal throughout the                            examined colon.                           The retroflexed view of the distal rectum and anal                            verge was normal and showed no anal or rectal                            abnormalities. Complications:            No immediate complications. Estimated Blood Loss:     Estimated blood loss: none. Impression:               - Moderate diverticulosis in the sigmoid colon and                            in the descending colon.                           - The distal rectum and anal verge are normal on                            retroflexion view.                            - No specimens collected. Recommendation:           - Patient has a contact number available for                            emergencies. The signs and symptoms of potential                            delayed complications were discussed with the                            patient. Return to normal activities tomorrow.                            Written discharge instructions were provided to the                            patient.                           -  Resume previous diet.                           - Continue present medications.                           - Repeat colonoscopy in 10 years for screening                            purposes. Hutch Rhett E. Tomasa Rand, MD 05/30/2023 4:16:57 PM This report has been signed electronically.

## 2023-05-31 ENCOUNTER — Telehealth: Payer: Self-pay

## 2023-05-31 ENCOUNTER — Encounter: Payer: Self-pay | Admitting: Family Medicine

## 2023-05-31 NOTE — Telephone Encounter (Signed)
  Follow up Call-     05/30/2023    3:17 PM  Call back number  Post procedure Call Back phone  # [phone]  Permission to leave phone message Yes     Patient questions:  Do you have a fever, pain , or abdominal swelling? No. Pain Score  0 *  Have you tolerated food without any problems? Yes.    Have you been able to return to your normal activities? Yes.    Do you have any questions about your discharge instructions: Diet   No. Medications  No. Follow up visit  No.  Do you have questions or concerns about your Care? No.  Actions: * If pain score is 4 or above: No action needed, pain <4.

## 2023-09-20 DIAGNOSIS — J309 Allergic rhinitis, unspecified: Secondary | ICD-10-CM | POA: Diagnosis not present

## 2023-09-20 DIAGNOSIS — R0981 Nasal congestion: Secondary | ICD-10-CM | POA: Diagnosis not present

## 2023-09-20 DIAGNOSIS — J3489 Other specified disorders of nose and nasal sinuses: Secondary | ICD-10-CM | POA: Diagnosis not present

## 2023-09-20 DIAGNOSIS — J029 Acute pharyngitis, unspecified: Secondary | ICD-10-CM | POA: Diagnosis not present

## 2024-03-08 ENCOUNTER — Other Ambulatory Visit: Payer: Self-pay | Admitting: Family Medicine

## 2024-03-08 DIAGNOSIS — E039 Hypothyroidism, unspecified: Secondary | ICD-10-CM

## 2024-03-08 DIAGNOSIS — E782 Mixed hyperlipidemia: Secondary | ICD-10-CM

## 2024-03-10 DIAGNOSIS — N950 Postmenopausal bleeding: Secondary | ICD-10-CM | POA: Diagnosis not present

## 2024-03-10 DIAGNOSIS — R102 Pelvic and perineal pain: Secondary | ICD-10-CM | POA: Diagnosis not present

## 2024-03-10 DIAGNOSIS — Z1231 Encounter for screening mammogram for malignant neoplasm of breast: Secondary | ICD-10-CM | POA: Diagnosis not present

## 2024-03-10 DIAGNOSIS — N951 Menopausal and female climacteric states: Secondary | ICD-10-CM | POA: Diagnosis not present

## 2024-03-10 DIAGNOSIS — Z01419 Encounter for gynecological examination (general) (routine) without abnormal findings: Secondary | ICD-10-CM | POA: Diagnosis not present

## 2024-03-10 LAB — HM MAMMOGRAPHY

## 2024-03-13 ENCOUNTER — Other Ambulatory Visit (INDEPENDENT_AMBULATORY_CARE_PROVIDER_SITE_OTHER): Payer: BC Managed Care – PPO

## 2024-03-13 DIAGNOSIS — E782 Mixed hyperlipidemia: Secondary | ICD-10-CM | POA: Diagnosis not present

## 2024-03-13 DIAGNOSIS — E039 Hypothyroidism, unspecified: Secondary | ICD-10-CM

## 2024-03-13 LAB — COMPREHENSIVE METABOLIC PANEL WITH GFR
ALT: 44 U/L — ABNORMAL HIGH (ref 0–35)
AST: 25 U/L (ref 0–37)
Albumin: 4.4 g/dL (ref 3.5–5.2)
Alkaline Phosphatase: 68 U/L (ref 39–117)
BUN: 13 mg/dL (ref 6–23)
CO2: 29 meq/L (ref 19–32)
Calcium: 9.3 mg/dL (ref 8.4–10.5)
Chloride: 106 meq/L (ref 96–112)
Creatinine, Ser: 0.93 mg/dL (ref 0.40–1.20)
GFR: 69.92 mL/min (ref >60.00)
Glucose, Bld: 114 mg/dL — ABNORMAL HIGH (ref 70–99)
Potassium: 4.5 meq/L (ref 3.5–5.1)
Sodium: 144 meq/L (ref 135–145)
Total Bilirubin: 0.5 mg/dL (ref 0.2–1.2)
Total Protein: 6.9 g/dL (ref 6.0–8.3)

## 2024-03-13 LAB — LIPID PANEL
Cholesterol: 158 mg/dL (ref 0–200)
HDL: 43.1 mg/dL (ref >39.00)
LDL Cholesterol: 53 mg/dL (ref 0–99)
NonHDL: 114.5
Total CHOL/HDL Ratio: 4
Triglycerides: 308 mg/dL — ABNORMAL HIGH (ref 0.0–149.0)
VLDL: 61.6 mg/dL — ABNORMAL HIGH (ref 0.0–40.0)

## 2024-03-13 LAB — TSH: TSH: 2.02 u[IU]/mL (ref 0.35–5.50)

## 2024-03-16 ENCOUNTER — Ambulatory Visit: Payer: Self-pay | Admitting: Family Medicine

## 2024-03-17 DIAGNOSIS — N950 Postmenopausal bleeding: Secondary | ICD-10-CM | POA: Diagnosis not present

## 2024-03-20 ENCOUNTER — Encounter: Payer: Self-pay | Admitting: Family Medicine

## 2024-03-20 ENCOUNTER — Encounter: Payer: BC Managed Care – PPO | Admitting: Family Medicine

## 2024-03-20 NOTE — Progress Notes (Deleted)
 Ph: [phone] Fax: [phone]   Patient ID: Ashley Boone, female    DOB: [birth year], 53 y.o.   MRN: [id number]  This visit was conducted in person.  There were no vitals taken for this visit.   CC: CPE Subjective:   HPI: Ashley Boone is a 53 y.o. female presenting on 03/20/2024 for No chief complaint on file.   Continues caring for mother.    Hypothyroidism - insurance changed formulary to new manufacturers 10/2022 - due for rpt labs.   Burning tongue on gabapentin 300mg  tid and omeprazole 40mg  bid, followed by Dr Georgina [facility].    Saw neurosurgeon - [facility] - told no follow up unless new issues develop.    Preventative: iFOB positive 03/2023  COLONOSCOPY 05/2023 - mod diverticulosis, rpt 10 yrs [facility]) Well woman exam with [facility] Dr Austin (retired) --> Dr Horacio. Normal paps latest 01/2021 - due for follow up - will call to schedule.  Mammo through OBGYN last 01/2021 Lung cancer screening - not eligible  Flu shot - yearly COVID vaccine - discussed, declines  Tetanus shot - unsure. Tdap ?2019 - not documented  Shingrix  - discussed  Seat belt use discussed  Sunscreen use discussed. No changing moles on skin  Non smoker  Alcohol - none  Dentist q6 mo  Eye exam - yearly [place])   Lives husband and son and daughters, and FIL (93yo) 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  Activity: walks dog  Diet: good water, fruits/vegetables daily, 1 cup decaf coffee     Relevant past medical, surgical, family and social history reviewed and updated as indicated. Interim medical history since our last visit reviewed. Allergies and medications reviewed and updated. Outpatient Medications Prior to Visit  Medication Sig Dispense Refill   atorvastatin  (LIPITOR) 20 MG tablet Take 1 tablet (20 mg total) by mouth daily. 90 tablet 4   fluticasone  (FLONASE ) 50 MCG/ACT nasal spray Place 2 sprays into both nostrils daily as needed for  allergies. 16 g 6   gabapentin (NEURONTIN) 300 MG capsule Take 1 capsule (300 mg total) by mouth 3 (three) times daily.     levothyroxine  (SYNTHROID ) 150 MCG tablet Take 1 tablet (150 mcg total) by mouth daily before breakfast. 90 tablet 4   Multiple Vitamin (MULTIVITAMIN) tablet Take 1 tablet by mouth daily.     omeprazole (PRILOSEC) 40 MG capsule Take 1 capsule (40 mg total) by mouth 2 (two) times daily.     No facility-administered medications prior to visit.     Per HPI unless specifically indicated in ROS section below Review of Systems  Constitutional:  Negative for activity change, appetite change, chills, fatigue, fever and unexpected weight change.  HENT:  Negative for hearing loss.   Eyes:  Negative for visual disturbance.  Respiratory:  Negative for cough, chest tightness, shortness of breath and wheezing.   Cardiovascular:  Negative for chest pain, palpitations and leg swelling.  Gastrointestinal:  Negative for abdominal distention, abdominal pain, blood in stool, constipation, diarrhea, nausea and vomiting.  Genitourinary:  Negative for difficulty urinating and hematuria.  Musculoskeletal:  Negative for arthralgias, myalgias and neck pain.  Skin:  Negative for rash.  Neurological:  Negative for dizziness, seizures, syncope and headaches.  Hematological:  Negative for adenopathy. Does not bruise/bleed easily.  Psychiatric/Behavioral:  Negative for dysphoric mood. The patient is not nervous/anxious.     Objective:  There were no vitals taken for this visit.  Wt Readings from  Last 3 Encounters:  05/30/23 241 lb 12.8 oz (109.7 kg)  05/14/23 241 lb 8 oz (109.5 kg)  03/20/23 232 lb (105.2 kg)      Physical Exam Vitals and nursing note reviewed.  Constitutional:      Appearance: Normal appearance. She is not ill-appearing.  HENT:     Head: Normocephalic and atraumatic.     Right Ear: Tympanic membrane, ear canal and external ear normal. There is no impacted cerumen.      Left Ear: Tympanic membrane, ear canal and external ear normal. There is no impacted cerumen.     Mouth/Throat:     Mouth: Mucous membranes are moist.     Pharynx: Oropharynx is clear. No oropharyngeal exudate or posterior oropharyngeal erythema.  Eyes:     General:        Right eye: No discharge.        Left eye: No discharge.     Extraocular Movements: Extraocular movements intact.     Conjunctiva/sclera: Conjunctivae normal.     Pupils: Pupils are equal, round, and reactive to light.  Neck:     Thyroid : No thyroid  mass or thyromegaly.  Cardiovascular:     Rate and Rhythm: Normal rate and regular rhythm.     Pulses: Normal pulses.     Heart sounds: Normal heart sounds. No murmur heard. Pulmonary:     Effort: Pulmonary effort is normal. No respiratory distress.     Breath sounds: Normal breath sounds. No wheezing, rhonchi or rales.  Abdominal:     General: Bowel sounds are normal. There is no distension.     Palpations: Abdomen is soft. There is no mass.     Tenderness: There is no abdominal tenderness. There is no guarding or rebound.     Hernia: No hernia is present.  Musculoskeletal:     Cervical back: Normal range of motion and neck supple. No rigidity.     Right lower leg: No edema.     Left lower leg: No edema.  Lymphadenopathy:     Cervical: No cervical adenopathy.  Skin:    General: Skin is warm and dry.     Findings: No rash.  Neurological:     General: No focal deficit present.     Mental Status: She is alert. Mental status is at baseline.  Psychiatric:        Mood and Affect: Mood normal.        Behavior: Behavior normal.       Results for orders placed or performed in visit on 03/13/24  TSH   Collection Time: 03/13/24  8:04 AM  Result Value Ref Range   TSH 2.02 0.35 - 5.50 uIU/mL  Comprehensive metabolic panel with GFR   Collection Time: 03/13/24  8:04 AM  Result Value Ref Range   Sodium 144 135 - 145 mEq/L   Potassium 4.5 3.5 - 5.1 mEq/L   Chloride  106 96 - 112 mEq/L   CO2 29 19 - 32 mEq/L   Glucose, Bld 114 (H) 70 - 99 mg/dL   BUN 13 6 - 23 mg/dL   Creatinine, Ser 9.06 0.40 - 1.20 mg/dL   Total Bilirubin 0.5 0.2 - 1.2 mg/dL   Alkaline Phosphatase 68 39 - 117 U/L   AST 25 0 - 37 U/L   ALT 44 (H) 0 - 35 U/L   Total Protein 6.9 6.0 - 8.3 g/dL   Albumin 4.4 3.5 - 5.2 g/dL   GFR 30.07 >39.99 mL/min  Calcium  9.3 8.4 - 10.5 mg/dL  Lipid panel   Collection Time: 03/13/24  8:04 AM  Result Value Ref Range   Cholesterol 158 0 - 200 mg/dL   Triglycerides 691.9 (H) 0.0 - 149.0 mg/dL   HDL 56.89 >60.99 mg/dL   VLDL 38.3 (H) 0.0 - 59.9 mg/dL   LDL Cholesterol 53 0 - 99 mg/dL   Total CHOL/HDL Ratio 4    NonHDL 114.50     Assessment & Plan:   Problem List Items Addressed This Visit   None    No orders of the defined types were placed in this encounter.   No orders of the defined types were placed in this encounter.   There are no Patient Instructions on file for this visit.  Follow up plan: No follow-ups on file.  Anton Blas, MD

## 2024-04-09 DIAGNOSIS — N950 Postmenopausal bleeding: Secondary | ICD-10-CM | POA: Diagnosis not present

## 2024-05-02 ENCOUNTER — Other Ambulatory Visit: Payer: Self-pay | Admitting: Family Medicine

## 2024-05-09 ENCOUNTER — Other Ambulatory Visit: Payer: Self-pay | Admitting: Family Medicine

## 2024-05-13 NOTE — Telephone Encounter (Signed)
 ERx

## 2024-05-17 ENCOUNTER — Other Ambulatory Visit: Payer: Self-pay

## 2024-05-17 ENCOUNTER — Ambulatory Visit
Admission: EM | Admit: 2024-05-17 | Discharge: 2024-05-17 | Disposition: A | Discharge status: Home / Self Care | Attending: Family Medicine | Admitting: Family Medicine

## 2024-05-17 DIAGNOSIS — R051 Acute cough: Secondary | ICD-10-CM | POA: Diagnosis not present

## 2024-05-17 DIAGNOSIS — J069 Acute upper respiratory infection, unspecified: Secondary | ICD-10-CM

## 2024-05-17 DIAGNOSIS — J029 Acute pharyngitis, unspecified: Secondary | ICD-10-CM | POA: Diagnosis not present

## 2024-05-17 LAB — POCT RAPID STREP A (OFFICE): Rapid Strep A Screen: NEGATIVE

## 2024-05-17 LAB — POC SOFIA SARS ANTIGEN FIA: SARS Coronavirus 2 Ag: NEGATIVE

## 2024-05-17 MED ORDER — PROMETHAZINE-DM 6.25-15 MG/5ML PO SYRP: 5.0000 mL | ORAL_SOLUTION | Freq: Three times a day (TID) | ORAL | 0 refills | Status: AC | PRN

## 2024-05-17 NOTE — ED Triage Notes (Signed)
 Pt c/o sore throat, bodyaches, right side of head and neck sore to the touch started last night.

## 2024-05-17 NOTE — ED Provider Notes (Signed)
 [facility]    CSN: [id number] Arrival date & time: 05/17/24  1227      History   Chief Complaint No chief complaint on file.   HPI Ashley Boone is a 53 y.o. female  presents for evaluation of URI symptoms for 4 days. Patient reports associated symptoms of sore throat, cough and congestion, swollen glands, body aches, malaise. Denies N/V/D, ear pain, fevers, shortness of breath. Patient does not have a hx of asthma. Patient is not an active smoker.   Reports no known sick contacts.  Pt has taken NyQuil OTC for symptoms. Pt has no other concerns at this time.   HPI  Past Medical History:  Diagnosis Date   Anemia    Depression    GERD (gastroesophageal reflux disease)    Hydrocephalus (HCC)    shunt ([facility])   Hyperlipidemia    Hypothyroidism    Obesity    Seizures (HCC)     Patient Active Problem List   Diagnosis Date Noted   Health maintenance examination 05/01/2018   Right thyroid  nodule 03/17/2018   Burning tongue syndrome 03/17/2018   Mixed hyperlipidemia 12/22/2016   Hypothyroidism    Hydrocephalus (HCC)    GERD (gastroesophageal reflux disease)    Severe obesity (BMI 35.0-39.9) with comorbidity [facility])     Past Surgical History:  Procedure Laterality Date   ANKLE SURGERY Left 06/18/1994   with plates and screws   COLONOSCOPY  05/2023   mod diverticulosis, rpt 10 yrs [facility])   VENTRICULOPERITONEAL SHUNT  [phone]   with revisions, [facility]    OB History   No obstetric history on file.      Home Medications    Prior to Admission medications   Medication Sig Start Date End Date Taking? Authorizing Provider  promethazine-dextromethorphan (PROMETHAZINE-DM) 6.25-15 MG/5ML syrup Take 5 mLs by mouth 3 (three) times daily as needed for cough. 05/17/24  Yes Naythan Douthit, Jodi R, NP  atorvastatin  (LIPITOR) 20 MG tablet TAKE 1 TABLET (20 MG TOTAL) BY MOUTH DAILY. 05/13/24   Rilla Baller, MD  fluticasone  (FLONASE ) 50 MCG/ACT nasal  spray Place 2 sprays into both nostrils daily as needed for allergies. 04/28/21   Rilla Baller, MD  gabapentin (NEURONTIN) 300 MG capsule Take 1 capsule (300 mg total) by mouth 3 (three) times daily. 03/20/23   Rilla Baller, MD  levothyroxine  (SYNTHROID ) 150 MCG tablet TAKE 1 TABLET (150 MCG TOTAL) BY MOUTH DAILY BEFORE BREAKFAST. 05/06/24   Rilla Baller, MD  Multiple Vitamin (MULTIVITAMIN) tablet Take 1 tablet by mouth daily.    [provider]  omeprazole (PRILOSEC) 40 MG capsule Take 1 capsule (40 mg total) by mouth 2 (two) times daily. 03/20/23   Rilla Baller, MD    Family History Family History  Problem Relation Age of Onset   CAD Father 5       MI   Hypertension Father    Diabetes Father    Cancer Neg Hx    Stroke Neg Hx     Social History Social History   Tobacco Use   Smoking status: Never   Smokeless tobacco: Never  Substance Use Topics   Alcohol use: No   Drug use: No     Allergies   Patient has no known allergies.   Review of Systems Review of Systems  Constitutional:  Positive for fatigue.  HENT:  Positive for congestion and sore throat.   Respiratory:  Positive for cough.   Musculoskeletal:  Positive for myalgias.  Physical Exam Triage Vital Signs ED Triage Vitals  Encounter Vitals Group     BP 05/17/24 1241 (!) 159/94     Girls Systolic BP Percentile --      Girls Diastolic BP Percentile --      Boys Systolic BP Percentile --      Boys Diastolic BP Percentile --      Pulse Rate 05/17/24 1241 83     Resp 05/17/24 1241 20     Temp 05/17/24 1241 99.1 F (37.3 C)     Temp Source 05/17/24 1241 Oral     SpO2 05/17/24 1241 94 %     Weight --      Height --      Head Circumference --      Peak Flow --      Pain Score 05/17/24 1240 8     Pain Loc --      Pain Education --      Exclude from Growth Chart --    No data found.  Updated Vital Signs BP (!) 159/94   Pulse 83   Temp 99.1 F (37.3 C) (Oral)   Resp 20    SpO2 94%   Visual Acuity Right Eye Distance:   Left Eye Distance:   Bilateral Distance:    Right Eye Near:   Left Eye Near:    Bilateral Near:     Physical Exam Vitals and nursing note reviewed.  Constitutional:      General: She is not in acute distress.    Appearance: She is well-developed. She is not ill-appearing.  HENT:     Head: Normocephalic and atraumatic.     Right Ear: Tympanic membrane and ear canal normal.     Left Ear: Tympanic membrane and ear canal normal.     Nose: Congestion present.     Mouth/Throat:     Mouth: Mucous membranes are moist.     Pharynx: Oropharynx is clear. Uvula midline. Posterior oropharyngeal erythema present.     Tonsils: No tonsillar exudate or tonsillar abscesses.  Eyes:     Conjunctiva/sclera: Conjunctivae normal.     Pupils: Pupils are equal, round, and reactive to light.  Cardiovascular:     Rate and Rhythm: Normal rate and regular rhythm.     Heart sounds: Normal heart sounds.  Pulmonary:     Effort: Pulmonary effort is normal.     Breath sounds: Normal breath sounds. No wheezing, rhonchi or rales.  Musculoskeletal:     Cervical back: Normal range of motion and neck supple.  Lymphadenopathy:     Cervical: No cervical adenopathy.  Skin:    General: Skin is warm and dry.  Neurological:     General: No focal deficit present.     Mental Status: She is alert and oriented to person, place, and time.  Psychiatric:        Mood and Affect: Mood normal.        Behavior: Behavior normal.      UC Treatments / Results  Labs (all labs ordered are listed, but only abnormal results are displayed) Labs Reviewed  POCT RAPID STREP A (OFFICE) - Normal  POC SOFIA SARS ANTIGEN FIA - Normal    EKG   Radiology No results found.  Procedures Procedures (including critical care time)  Medications Ordered in UC Medications - No data to display  Initial Impression / Assessment and Plan / UC Course  I have reviewed the triage  vital signs and the nursing  notes.  Pertinent labs & imaging results that were available during my care of the patient were reviewed by me and considered in my medical decision making (see chart for details).     Reviewed exam and symptoms with patient.  No red flags.  Negative COVID and strep throat testing.  Discussed viral illness and symptomatic treatment.  Promethazine DM as needed for cough.  Encouraged rest fluids and PCP follow-up in 2 to 3 days for recheck.  ER precautions reviewed. Final Clinical Impressions(s) / UC Diagnoses   Final diagnoses:  Sore throat  Acute cough  Viral upper respiratory illness     Discharge Instructions      You tested negative for COVID and strep throat.  You may take Promethazine DM as needed for your cough.  Please of this medication will make you drowsy.  Do not drink alcohol or drive on this medication.  Lots of rest and fluids and please follow-up with your PCP in 2 to 3 days for recheck.  Please go to the ER if you develop any worsening symptoms.  Hope you feel better soon!     ED Prescriptions     Medication Sig Dispense Auth. Provider   promethazine-dextromethorphan (PROMETHAZINE-DM) 6.25-15 MG/5ML syrup Take 5 mLs by mouth 3 (three) times daily as needed for cough. 118 mL Beverlyn Mcginness, Jodi R, NP      PDMP not reviewed this encounter.   Loreda Myla SAUNDERS, NP 05/17/24 1315

## 2024-05-17 NOTE — Discharge Instructions (Signed)
 You tested negative for COVID and strep throat.  You may take Promethazine DM as needed for your cough.  Please of this medication will make you drowsy.  Do not drink alcohol or drive on this medication.  Lots of rest and fluids and please follow-up with your PCP in 2 to 3 days for recheck.  Please go to the ER if you develop any worsening symptoms.  Hope you feel better soon!

## 2024-05-20 ENCOUNTER — Encounter: Payer: Self-pay | Admitting: Family Medicine

## 2024-05-20 ENCOUNTER — Ambulatory Visit (INDEPENDENT_AMBULATORY_CARE_PROVIDER_SITE_OTHER): Admitting: Family Medicine

## 2024-05-20 VITALS — BP 138/88 | HR 85 | Temp 99.5°F | Ht 67.0 in | Wt 242.3 lb

## 2024-05-20 DIAGNOSIS — E782 Mixed hyperlipidemia: Secondary | ICD-10-CM

## 2024-05-20 DIAGNOSIS — Z0000 Encounter for general adult medical examination without abnormal findings: Secondary | ICD-10-CM | POA: Diagnosis not present

## 2024-05-20 DIAGNOSIS — J019 Acute sinusitis, unspecified: Secondary | ICD-10-CM | POA: Diagnosis not present

## 2024-05-20 DIAGNOSIS — E039 Hypothyroidism, unspecified: Secondary | ICD-10-CM | POA: Diagnosis not present

## 2024-05-20 DIAGNOSIS — K219 Gastro-esophageal reflux disease without esophagitis: Secondary | ICD-10-CM

## 2024-05-20 MED ORDER — LEVOTHYROXINE SODIUM 150 MCG PO TABS: 150.0000 ug | ORAL_TABLET | Freq: Every day | ORAL | 3 refills | Status: AC

## 2024-05-20 MED ORDER — AMOXICILLIN-POT CLAVULANATE 875-125 MG PO TABS: 1.0000 | ORAL_TABLET | Freq: Two times a day (BID) | ORAL | 0 refills | Status: AC

## 2024-05-20 MED ORDER — ATORVASTATIN CALCIUM 20 MG PO TABS: 20.0000 mg | ORAL_TABLET | Freq: Every day | ORAL | 3 refills | Status: AC

## 2024-05-20 NOTE — Assessment & Plan Note (Addendum)
 Anticipate viral process given short duration.  Reviewed supportive measures - flonase , nasal saline, mucinex .  WASP for augmentin  7d course printed with instructions when to fill.

## 2024-05-20 NOTE — Assessment & Plan Note (Signed)
Chronic, stable. Continue levothyroxine. 

## 2024-05-20 NOTE — Patient Instructions (Addendum)
 We will request latest mammogram from [facility] 02/2024 BP is staying a bit on the high side - increase walking routine for goal weight loss, limit salt/sodium in the diet, drink plenty of water and eat plenty of fruits/vegetables, whole grains.   You likely have a sinus infection, likely viral at this point. Take flonase  nasal steroid.  Push fluids and plenty of rest. Nasal saline irrigation or neti pot to help drain sinuses. May use plain mucinex  with plenty of fluid to help mobilize mucous. If fever returns or persists, or trouble opening/closing mouth, difficulty swallowing, or worsening or ongoing symptoms past 7-10 days, fill antibiotic printed out today.   Return as needed or in 1 year for next physical

## 2024-05-20 NOTE — Assessment & Plan Note (Signed)
 Chronic, continue atorvastatin  20mg  daily however triglycerides remain elevated - reviewed diet choices to improve triglyceride control. The 10-year ASCVD risk score (Arnett DK, et al., 2019) is: 2.1%   Values used to calculate the score:     Age: 53 years     Clincally relevant sex: Female     Is Non-Hispanic African American: No     Diabetic: No     Tobacco smoker: No     Systolic Blood Pressure: 138 mmHg     Is BP treated: No     HDL Cholesterol: 43.1 mg/dL     Total Cholesterol: 158 mg/dL

## 2024-05-20 NOTE — Assessment & Plan Note (Signed)
 Reviewed healthy diet and lifestyle changes to effect sustainable weight loss.

## 2024-05-20 NOTE — Assessment & Plan Note (Signed)
 Continues PPI BID through ENT.

## 2024-05-20 NOTE — Progress Notes (Signed)
 Ph: [phone] Fax: [phone]   Patient ID: Ashley Boone Bars, female    DOB: [birth year], 53 y.o.   MRN: [id number]  This visit was conducted in person.  BP 138/88 (BP Location: Right Arm, Cuff Size: Large)   Pulse 85   Temp 99.5 F (37.5 C) (Oral)   Ht 5' 7 (1.702 m)   Wt 242 lb 4.8 oz (109.9 kg)   SpO2 98%   BMI 37.95 kg/m   BP Readings from Last 3 Encounters:  05/20/24 138/88  05/17/24 (!) 159/94  05/30/23 (!) 143/89    CC: CPE Subjective:   HPI: Ashley Boone is a 53 y.o. female presenting on 05/20/2024 for Annual Exam (FYI pt is currently sick since Sunday, got treated same day at urgent care, tested neg for strep and covid. She had sniffle and cough//Was not been tested for flu,/pt reports New symptoms since being treated at urgent care starting last night of  of sinus pressure)   URI symptoms over the past 4 days - seen at [facility] thought viral. RST and COVID negative. Treated with cough syrup. Now developing R frontal headache and R maxillary sinus pressure. Fever Tmax 101 2d ago. No ear or tooth pain. Husband sick at home.    Burning tongue on gabapentin 300mg  tid and omeprazole 40mg  bid, followed by Dr Georgina [facility].   Known hydrocephalus s/p VP shunt - saw neurosurgeon Cabbell - told no follow up unless new issues develop.   Post menopausal spotting/ cramping - s/p reassuringly normal biopsy by GYN   Preventative: COLONOSCOPY 05/2023 - mod diverticulosis, rpt 10 yrs [place]) Well woman exam with [facility] [facility] Dr Austin (retired) --> [place] with normal paps  Mammo through OBGYN last 02/2024 - records requested.  Lung cancer screening - not eligible  Flu shot - yearly COVID vaccine - discussed, declines  Prevnar-20 - deferred due to current illness Td 01/2017  Shingrix  - 03/2023, 05/2023  Seat belt use discussed  Sunscreen use discussed. No changing moles on skin  Non smoker  Alcohol - none  Dentist q6 mo  Eye exam - yearly [place] -->  [place])    Lives husband and son and daughters, and FIL (93yo) 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  Activity: walks dog  Diet: good water, fruits/vegetables daily, 1 cup decaf coffee     Relevant past medical, surgical, family and social history reviewed and updated as indicated. Interim medical history since our last visit reviewed. Allergies and medications reviewed and updated. Outpatient Medications Prior to Visit  Medication Sig Dispense Refill   fluticasone  (FLONASE ) 50 MCG/ACT nasal spray Place 2 sprays into both nostrils daily as needed for allergies. 16 g 6   gabapentin (NEURONTIN) 300 MG capsule Take 1 capsule (300 mg total) by mouth 3 (three) times daily.     Multiple Vitamin (MULTIVITAMIN) tablet Take 1 tablet by mouth daily.     omeprazole (PRILOSEC) 40 MG capsule Take 1 capsule (40 mg total) by mouth 2 (two) times daily.     promethazine-dextromethorphan (PROMETHAZINE-DM) 6.25-15 MG/5ML syrup Take 5 mLs by mouth 3 (three) times daily as needed for cough. 118 mL 0   atorvastatin  (LIPITOR) 20 MG tablet TAKE 1 TABLET (20 MG TOTAL) BY MOUTH DAILY. 90 tablet 0   levothyroxine  (SYNTHROID ) 150 MCG tablet TAKE 1 TABLET (150 MCG TOTAL) BY MOUTH DAILY BEFORE BREAKFAST. 30 tablet 0   No facility-administered medications prior to visit.     Per HPI  unless specifically indicated in ROS section below Review of Systems  Constitutional:  Positive for fever. Negative for activity change, appetite change, chills, fatigue and unexpected weight change.  HENT:  Positive for congestion. Negative for hearing loss.   Eyes:  Negative for visual disturbance.  Respiratory:  Positive for cough. Negative for chest tightness, shortness of breath and wheezing.   Cardiovascular:  Negative for chest pain, palpitations and leg swelling.  Gastrointestinal:  Negative for abdominal distention, abdominal pain, blood in stool, constipation, diarrhea, nausea and vomiting.  Genitourinary:   Negative for difficulty urinating and hematuria.  Musculoskeletal:  Negative for arthralgias, myalgias and neck pain.  Skin:  Negative for rash.  Neurological:  Negative for dizziness, seizures, syncope and headaches.  Hematological:  Negative for adenopathy. Does not bruise/bleed easily.  Psychiatric/Behavioral:  Negative for dysphoric mood. The patient is not nervous/anxious.     Objective:  BP 138/88 (BP Location: Right Arm, Cuff Size: Large)   Pulse 85   Temp 99.5 F (37.5 C) (Oral)   Ht 5' 7 (1.702 m)   Wt 242 lb 4.8 oz (109.9 kg)   SpO2 98%   BMI 37.95 kg/m   Wt Readings from Last 3 Encounters:  05/20/24 242 lb 4.8 oz (109.9 kg)  05/30/23 241 lb 12.8 oz (109.7 kg)  05/14/23 241 lb 8 oz (109.5 kg)      Physical Exam Vitals and nursing note reviewed.  Constitutional:      Appearance: Normal appearance. She is not ill-appearing.  HENT:     Head: Normocephalic and atraumatic.     Right Ear: Tympanic membrane, ear canal and external ear normal. There is no impacted cerumen.     Left Ear: Tympanic membrane, ear canal and external ear normal. There is no impacted cerumen.     Nose: Congestion present.     Right Sinus: Maxillary sinus tenderness and frontal sinus tenderness present.     Left Sinus: No maxillary sinus tenderness or frontal sinus tenderness.     Comments: Wearing mask    Mouth/Throat:     Mouth: Mucous membranes are moist.     Pharynx: Oropharynx is clear. No oropharyngeal exudate or posterior oropharyngeal erythema.     Comments: Wearing mask Eyes:     General:        Right eye: No discharge.        Left eye: No discharge.     Extraocular Movements: Extraocular movements intact.     Conjunctiva/sclera: Conjunctivae normal.     Pupils: Pupils are equal, round, and reactive to light.  Neck:     Thyroid : No thyroid  mass or thyromegaly.  Cardiovascular:     Rate and Rhythm: Normal rate and regular rhythm.     Pulses: Normal pulses.     Heart sounds:  Normal heart sounds. No murmur heard. Pulmonary:     Effort: Pulmonary effort is normal. No respiratory distress.     Breath sounds: Normal breath sounds. No wheezing, rhonchi or rales.  Abdominal:     General: Bowel sounds are normal. There is no distension.     Palpations: Abdomen is soft. There is no mass.     Tenderness: There is no abdominal tenderness. There is no guarding or rebound.     Hernia: No hernia is present.  Musculoskeletal:     Cervical back: Normal range of motion and neck supple. No rigidity.     Right lower leg: No edema.     Left lower leg: No edema.  Lymphadenopathy:     Cervical: No cervical adenopathy.  Skin:    General: Skin is warm and dry.     Findings: No rash.  Neurological:     General: No focal deficit present.     Mental Status: She is alert. Mental status is at baseline.  Psychiatric:        Mood and Affect: Mood normal.        Behavior: Behavior normal.       Results for orders placed or performed during the hospital encounter of 05/17/24  POCT rapid strep A   Collection Time: 05/17/24  1:00 PM  Result Value Ref Range   Rapid Strep A Screen Negative Negative  POC SARS Coronavirus 2 Ag   Collection Time: 05/17/24  1:09 PM  Result Value Ref Range   SARS Coronavirus 2 Ag Negative Negative   Lab Results  Component Value Date   CHOL 158 03/13/2024   HDL 43.10 03/13/2024   LDLCALC 53 03/13/2024   LDLDIRECT 91.0 02/05/2023   TRIG 308.0 (H) 03/13/2024   CHOLHDL 4 03/13/2024    Lab Results  Component Value Date   NA 144 03/13/2024   CL 106 03/13/2024   K 4.5 03/13/2024   CO2 29 03/13/2024   BUN 13 03/13/2024   CREATININE 0.93 03/13/2024   GFR 69.92 03/13/2024   CALCIUM  9.3 03/13/2024   ALBUMIN 4.4 03/13/2024   GLUCOSE 114 (H) 03/13/2024    Lab Results  Component Value Date   ALT 44 (H) 03/13/2024   AST 25 03/13/2024   ALKPHOS 68 03/13/2024   BILITOT 0.5 03/13/2024    Lab Results  Component Value Date   WBC 7.1 05/14/2023    HGB 15.0 05/14/2023   HCT 45.3 05/14/2023   MCV 89.5 05/14/2023   PLT 305.0 05/14/2023    Lab Results  Component Value Date   TSH 2.02 03/13/2024    Assessment & Plan:   Problem List Items Addressed This Visit     Health maintenance examination - Primary (Chronic)   Preventative protocols reviewed and updated unless pt declined. Discussed healthy diet and lifestyle.       Hypothyroidism   Chronic, stable. Continue levothyroxine        Relevant Medications   levothyroxine  (SYNTHROID ) 150 MCG tablet   GERD (gastroesophageal reflux disease)   Continues PPI BID through ENT.       Severe obesity (BMI 35.0-39.9) with comorbidity (HCC)   Reviewed healthy diet and lifestyle changes to effect sustainable weight loss.        Acute sinusitis   Anticipate viral process given short duration.  Reviewed supportive measures - flonase , nasal saline, mucinex .  WASP for augmentin  7d course printed with instructions when to fill.       Relevant Medications   amoxicillin -clavulanate (AUGMENTIN ) 875-125 MG tablet   Mixed hyperlipidemia   Chronic, continue atorvastatin  20mg  daily however triglycerides remain elevated - reviewed diet choices to improve triglyceride control. The 10-year ASCVD risk score (Arnett DK, et al., 2019) is: 2.1%   Values used to calculate the score:     Age: 20 years     Clincally relevant sex: Female     Is Non-Hispanic African American: No     Diabetic: No     Tobacco smoker: No     Systolic Blood Pressure: 138 mmHg     Is BP treated: No     HDL Cholesterol: 43.1 mg/dL     Total Cholesterol: 158 mg/dL  Relevant Medications   atorvastatin  (LIPITOR) 20 MG tablet     Meds ordered this encounter  Medications   atorvastatin  (LIPITOR) 20 MG tablet    Sig: Take 1 tablet (20 mg total) by mouth daily.    Dispense:  90 tablet    Refill:  3   levothyroxine  (SYNTHROID ) 150 MCG tablet    Sig: Take 1 tablet (150 mcg total) by mouth daily before breakfast.     Dispense:  90 tablet    Refill:  3   amoxicillin -clavulanate (AUGMENTIN ) 875-125 MG tablet    Sig: Take 1 tablet by mouth 2 (two) times daily for 7 days.    Dispense:  14 tablet    Refill:  0    No orders of the defined types were placed in this encounter.   Patient Instructions  We will request latest mammogram from [facility] 02/2024 BP is staying a bit on the high side - increase walking routine for goal weight loss, limit salt/sodium in the diet, drink plenty of water and eat plenty of fruits/vegetables, whole grains.   You likely have a sinus infection, likely viral at this point. Take flonase  nasal steroid.  Push fluids and plenty of rest. Nasal saline irrigation or neti pot to help drain sinuses. May use plain mucinex  with plenty of fluid to help mobilize mucous. If fever returns or persists, or trouble opening/closing mouth, difficulty swallowing, or worsening or ongoing symptoms past 7-10 days, fill antibiotic printed out today.   Return as needed or in 1 year for next physical  Follow up plan: Return in about 1 year (around 05/20/2025) for annual exam, prior fasting for blood work.  Anton Blas, MD

## 2024-05-20 NOTE — Assessment & Plan Note (Signed)
 Preventative protocols reviewed and updated unless pt declined. Discussed healthy diet and lifestyle.

## 2024-05-22 ENCOUNTER — Ambulatory Visit: Payer: Self-pay | Admitting: Family Medicine

## 2024-05-22 DIAGNOSIS — Z0000 Encounter for general adult medical examination without abnormal findings: Secondary | ICD-10-CM
